# Patient Record
Sex: Female | Born: 1935 | Race: White | Hispanic: No | Marital: Single | Smoking: Never smoker
Health system: Southern US, Community
[De-identification: ages and names within clinical notes are randomized; demographics above are authoritative.]

## PROBLEM LIST (undated history)

## (undated) DIAGNOSIS — F32A Depression, unspecified: Secondary | ICD-10-CM

## (undated) DIAGNOSIS — I82409 Acute embolism and thrombosis of unspecified deep veins of unspecified lower extremity: Secondary | ICD-10-CM

## (undated) DIAGNOSIS — M1990 Unspecified osteoarthritis, unspecified site: Secondary | ICD-10-CM

## (undated) DIAGNOSIS — I4891 Unspecified atrial fibrillation: Secondary | ICD-10-CM

## (undated) DIAGNOSIS — D649 Anemia, unspecified: Secondary | ICD-10-CM

## (undated) DIAGNOSIS — G629 Polyneuropathy, unspecified: Secondary | ICD-10-CM

## (undated) DIAGNOSIS — F329 Major depressive disorder, single episode, unspecified: Secondary | ICD-10-CM

## (undated) HISTORY — DX: Unspecified osteoarthritis, unspecified site: M19.90

## (undated) HISTORY — DX: Acute embolism and thrombosis of unspecified deep veins of unspecified lower extremity: I82.409

## (undated) HISTORY — DX: Major depressive disorder, single episode, unspecified: F32.9

## (undated) HISTORY — PX: UTERINE SUSPENSION: SUR1430

## (undated) HISTORY — DX: Depression, unspecified: F32.A

## (undated) HISTORY — DX: Unspecified atrial fibrillation: I48.91

## (undated) HISTORY — DX: Anemia, unspecified: D64.9

---
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
## 2002-07-29 HISTORY — PX: ROTATOR CUFF REPAIR: SHX139

## 2004-07-29 HISTORY — PX: TIBIA FRACTURE SURGERY: SHX806

## 2011-01-14 ENCOUNTER — Emergency Department (INDEPENDENT_AMBULATORY_CARE_PROVIDER_SITE_OTHER): Payer: Medicare PPO

## 2011-01-14 ENCOUNTER — Emergency Department (HOSPITAL_BASED_OUTPATIENT_CLINIC_OR_DEPARTMENT_OTHER)
Admission: EM | Admit: 2011-01-14 | Discharge: 2011-01-14 | Disposition: A | Discharge status: Home / Self Care | Payer: Medicare PPO | Attending: Emergency Medicine | Admitting: Emergency Medicine

## 2011-01-14 DIAGNOSIS — S2239XA Fracture of one rib, unspecified side, initial encounter for closed fracture: Secondary | ICD-10-CM | POA: Insufficient documentation

## 2011-01-14 DIAGNOSIS — R079 Chest pain, unspecified: Secondary | ICD-10-CM

## 2011-01-14 DIAGNOSIS — I4891 Unspecified atrial fibrillation: Secondary | ICD-10-CM | POA: Insufficient documentation

## 2011-01-14 DIAGNOSIS — W19XXXA Unspecified fall, initial encounter: Secondary | ICD-10-CM | POA: Insufficient documentation

## 2011-01-14 DIAGNOSIS — R109 Unspecified abdominal pain: Secondary | ICD-10-CM

## 2011-01-17 ENCOUNTER — Emergency Department (HOSPITAL_BASED_OUTPATIENT_CLINIC_OR_DEPARTMENT_OTHER)
Admission: EM | Admit: 2011-01-17 | Discharge: 2011-01-17 | Disposition: A | Discharge status: Home / Self Care | Payer: Medicare PPO | Attending: Emergency Medicine | Admitting: Emergency Medicine

## 2011-01-17 DIAGNOSIS — I4891 Unspecified atrial fibrillation: Secondary | ICD-10-CM | POA: Insufficient documentation

## 2011-01-17 DIAGNOSIS — B029 Zoster without complications: Secondary | ICD-10-CM | POA: Insufficient documentation

## 2011-01-28 ENCOUNTER — Emergency Department (HOSPITAL_BASED_OUTPATIENT_CLINIC_OR_DEPARTMENT_OTHER)
Admission: EM | Admit: 2011-01-28 | Discharge: 2011-01-28 | Disposition: A | Discharge status: Home / Self Care | Payer: Medicare PPO | Attending: Emergency Medicine | Admitting: Emergency Medicine

## 2011-01-28 DIAGNOSIS — B029 Zoster without complications: Secondary | ICD-10-CM | POA: Insufficient documentation

## 2011-01-28 DIAGNOSIS — I4891 Unspecified atrial fibrillation: Secondary | ICD-10-CM | POA: Insufficient documentation

## 2011-01-31 ENCOUNTER — Ambulatory Visit (INDEPENDENT_AMBULATORY_CARE_PROVIDER_SITE_OTHER): Payer: Medicare PPO | Admitting: Internal Medicine

## 2011-01-31 DIAGNOSIS — I4891 Unspecified atrial fibrillation: Secondary | ICD-10-CM

## 2011-01-31 DIAGNOSIS — D649 Anemia, unspecified: Secondary | ICD-10-CM

## 2011-01-31 DIAGNOSIS — Z86718 Personal history of other venous thrombosis and embolism: Secondary | ICD-10-CM

## 2011-01-31 DIAGNOSIS — B028 Zoster with other complications: Secondary | ICD-10-CM

## 2011-02-05 ENCOUNTER — Encounter: Payer: Self-pay | Admitting: Internal Medicine

## 2011-02-05 ENCOUNTER — Ambulatory Visit (INDEPENDENT_AMBULATORY_CARE_PROVIDER_SITE_OTHER): Payer: Medicare PPO | Admitting: Internal Medicine

## 2011-02-05 ENCOUNTER — Other Ambulatory Visit: Payer: Self-pay | Admitting: Internal Medicine

## 2011-02-05 ENCOUNTER — Other Ambulatory Visit (HOSPITAL_COMMUNITY)
Admission: RE | Admit: 2011-02-05 | Discharge: 2011-02-05 | Disposition: A | Discharge status: Home / Self Care | Payer: Medicare PPO | Source: Ambulatory Visit | Attending: Internal Medicine | Admitting: Internal Medicine

## 2011-02-05 VITALS — BP 138/86 | HR 92 | Temp 97.3°F | Resp 20 | Ht 64.5 in | Wt 127.0 lb

## 2011-02-05 DIAGNOSIS — B029 Zoster without complications: Secondary | ICD-10-CM

## 2011-02-05 DIAGNOSIS — Z1159 Encounter for screening for other viral diseases: Secondary | ICD-10-CM | POA: Insufficient documentation

## 2011-02-05 DIAGNOSIS — Z780 Asymptomatic menopausal state: Secondary | ICD-10-CM

## 2011-02-05 DIAGNOSIS — N6320 Unspecified lump in the left breast, unspecified quadrant: Secondary | ICD-10-CM

## 2011-02-05 DIAGNOSIS — I82409 Acute embolism and thrombosis of unspecified deep veins of unspecified lower extremity: Secondary | ICD-10-CM | POA: Insufficient documentation

## 2011-02-05 DIAGNOSIS — I4891 Unspecified atrial fibrillation: Secondary | ICD-10-CM

## 2011-02-05 DIAGNOSIS — F3289 Other specified depressive episodes: Secondary | ICD-10-CM

## 2011-02-05 DIAGNOSIS — Z124 Encounter for screening for malignant neoplasm of cervix: Secondary | ICD-10-CM | POA: Insufficient documentation

## 2011-02-05 DIAGNOSIS — Z800 Family history of malignant neoplasm of digestive organs: Secondary | ICD-10-CM

## 2011-02-05 DIAGNOSIS — D649 Anemia, unspecified: Secondary | ICD-10-CM | POA: Insufficient documentation

## 2011-02-05 DIAGNOSIS — F329 Major depressive disorder, single episode, unspecified: Secondary | ICD-10-CM

## 2011-02-05 DIAGNOSIS — I8390 Asymptomatic varicose veins of unspecified lower extremity: Secondary | ICD-10-CM

## 2011-02-05 DIAGNOSIS — M1990 Unspecified osteoarthritis, unspecified site: Secondary | ICD-10-CM | POA: Insufficient documentation

## 2011-02-05 DIAGNOSIS — N63 Unspecified lump in unspecified breast: Secondary | ICD-10-CM

## 2011-02-05 DIAGNOSIS — R634 Abnormal weight loss: Secondary | ICD-10-CM

## 2011-02-05 DIAGNOSIS — Z01419 Encounter for gynecological examination (general) (routine) without abnormal findings: Secondary | ICD-10-CM

## 2011-02-05 MED ORDER — SERTRALINE HCL 25 MG PO TABS: 25.0000 mg | ORAL_TABLET | Freq: Every day | ORAL | Status: DC

## 2011-02-05 NOTE — Progress Notes (Signed)
Subjective:     Patient ID: Felicia Lara, female   DOB: 07-22-1934, 77 y.o.   MRN: [id number]  HPI  Felicia Lara presents today for comprehensive evaluation.  She is tearful and states " I  am living in a horror House"  Here to help with grand-children but mother of child tried to adopt baby away without biologic father being aware of pregnancy.  Lots of fighting in household.  Grand-kids very stressed along with pt. Pt states she is depressed almost daily, not eating and losting weight.  She denies H/D denies suicidal thoughts.  She has not seen a therapist.  She requests an antidepressant.  Crying during interview.  Zoster almost healed .  No  Pain last few days.    Has lotsof dialtaed leg veins  Needs a podiatrist  Allergies  Allergen Reactions  . Ibuprofen Rash   Past Medical History  Diagnosis Date  . Atrial fibrillation   . Herpes zoster   . DVT (deep venous thrombosis)     per pt history  . Arthritis   . Anemia     per pt history   Past Surgical History  Procedure Date  . Tonsillectomy 1944  . Rotator cuff repair 2004  . Tibia fracture surgery 2006  . Uterine suspension   . Uterine suspension    History   Social History  . Marital Status: Single    Spouse Name: N/A    Number of Children: 1  . Years of Education: hs grad   Occupational History  . Not on file.   Social History Main Topics  . Smoking status: Never Smoker   . Smokeless tobacco: Never Used  . Alcohol Use: Yes     socially  . Drug Use: No  . Sexually Active: Not Currently   Other Topics Concern  . Not on file   Social History Narrative  . No narrative on file   Family History  Problem Relation Age of Onset  . Cancer Mother     colon  . Parkinsonism Father   . Alzheimer's disease Brother    Patient Active Problem List  Diagnoses  . Atrial fibrillation  . Arthritis  . Anemia  . Herpes zoster  . DVT (deep venous thrombosis)  . FHx: colon cancer       Review of Systems    Constitutional: Negative for fever and fatigue. Unexpected weight change: pt has lost approx 20 lbs.  HENT: Negative for nosebleeds, sore throat and trouble swallowing.   Eyes: Negative for discharge, redness and visual disturbance.  Respiratory: Negative for cough, chest tightness, shortness of breath and wheezing.   Cardiovascular: Negative.  Negative for chest pain and leg swelling.  Gastrointestinal: Negative for nausea, vomiting, abdominal pain, diarrhea and abdominal distention.  Genitourinary: Negative for dysuria, urgency, frequency, flank pain, vaginal bleeding, vaginal discharge and difficulty urinating.  Musculoskeletal: Negative for myalgias, joint swelling and arthralgias.  Skin: Negative for rash.  Neurological: Negative for dizziness, tremors, seizures, syncope, light-headedness and headaches.  Hematological: Negative.  Negative for adenopathy. Does not bruise/bleed easily.  Psychiatric/Behavioral: Negative for suicidal ideas. The patient is not nervous/anxious.        Objective:   Physical Exam  Constitutional: She appears well-developed and well-nourished. She is cooperative.  HENT:  Head: Normocephalic and atraumatic.  Mouth/Throat: Oropharynx is clear and moist.  Eyes: Conjunctivae and EOM are normal. Pupils are equal, round, and reactive to light.  Neck: No JVD present. Carotid bruit is not present. No mass  and no thyromegaly present.  Cardiovascular: Normal rate, regular rhythm, S1 normal, S2 normal and intact distal pulses.  Exam reveals no gallop and no friction rub.   No murmur heard.      No LE edema.  Multiple venous varicosities LE  Pulmonary/Chest: She has no decreased breath sounds. She has no wheezes. She has no rhonchi. She has no rales. Right breast exhibits no mass, no nipple discharge, no skin change and no tenderness. Left breast exhibits mass (small mass felt 7 0'clock near nipple). Left breast exhibits no nipple discharge, no skin change and no  tenderness.  Abdominal: Bowel sounds are normal. She exhibits no distension. There is no hepatosplenomegaly. There is no tenderness. There is no CVA tenderness.  Genitourinary: Vagina normal and uterus normal. Rectal exam shows no mass and no tenderness. Guaiac negative stool. No breast tenderness or discharge. No labial fusion. There is no rash or lesion on the right labia. There is no rash or lesion on the left labia. Cervix exhibits no motion tenderness, no discharge and no friability. Right adnexum displays no mass and no tenderness. Left adnexum displays no mass and no tenderness.  Musculoskeletal:       No active synovitis in joints.  No erythema, edema to joints  Lymphadenopathy:    She has no cervical adenopathy.    She has no axillary adenopathy.       Right: No inguinal adenopathy present.       Left: No inguinal adenopathy present.  Neurological: She is alert. She has normal strength and normal reflexes. No cranial nerve deficit or sensory deficit.       Grossly nonfocal exam  Skin: No abrasion, no ecchymosis and no lesion noted. No cyanosis or erythema. Nails show no clubbing.       Assessment:   I spent 45 minutes with this pt 1) Depression  2) Herpes Zoster Thoracic  3) Venous Varicosities  4)  Patient Active Problem List  Diagnoses  . Atrial fibrillation  . Arthritis  . Anemia  . Herpes zoster  . DVT (deep venous thrombosis)  . FHx: colon cancer   Will schedule mammogram and bone density    Plan:     1)  Begin Zoloft 25 mg daily.  Gave number to 3 different Psychaitrists for pt to call to schedule apppt.  She voices understanding 2)   Resolving nicely  On no pain meds now 3)  No symptoms now 4)  On coumadin  Has cardiologist in [place] 5) stable 6)  CBC now stable 7)  Had [place]oc[place] i [place] in 2010.  Will need repeat in 2013

## 2011-02-05 NOTE — Patient Instructions (Signed)
Try nutritional supplements Call for appointment with psychiatrist Take medicine as prscribed

## 2011-02-20 ENCOUNTER — Ambulatory Visit
Admission: RE | Admit: 2011-02-20 | Discharge: 2011-02-20 | Disposition: A | Discharge status: Home / Self Care | Payer: Medicare PPO | Source: Ambulatory Visit | Attending: Internal Medicine | Admitting: Internal Medicine

## 2011-02-20 DIAGNOSIS — Z780 Asymptomatic menopausal state: Secondary | ICD-10-CM

## 2011-02-20 DIAGNOSIS — N6320 Unspecified lump in the left breast, unspecified quadrant: Secondary | ICD-10-CM

## 2011-02-25 ENCOUNTER — Encounter: Payer: Self-pay | Admitting: Internal Medicine

## 2011-02-25 DIAGNOSIS — M810 Age-related osteoporosis without current pathological fracture: Secondary | ICD-10-CM | POA: Insufficient documentation

## 2011-02-28 ENCOUNTER — Encounter: Payer: Self-pay | Admitting: Emergency Medicine

## 2011-03-06 ENCOUNTER — Encounter: Payer: Self-pay | Admitting: Emergency Medicine

## 2011-03-19 ENCOUNTER — Ambulatory Visit (INDEPENDENT_AMBULATORY_CARE_PROVIDER_SITE_OTHER): Payer: Medicare PPO | Admitting: Internal Medicine

## 2011-03-19 ENCOUNTER — Other Ambulatory Visit: Payer: Self-pay | Admitting: Emergency Medicine

## 2011-03-19 ENCOUNTER — Encounter: Payer: Self-pay | Admitting: Internal Medicine

## 2011-03-19 DIAGNOSIS — M810 Age-related osteoporosis without current pathological fracture: Secondary | ICD-10-CM

## 2011-03-19 DIAGNOSIS — F329 Major depressive disorder, single episode, unspecified: Secondary | ICD-10-CM

## 2011-03-19 MED ORDER — SERTRALINE HCL 50 MG PO TABS: 50.0000 mg | ORAL_TABLET | Freq: Every day | ORAL | Status: DC

## 2011-03-19 MED ORDER — ALENDRONATE SODIUM 70 MG PO TABS: 70.0000 mg | ORAL_TABLET | ORAL | Status: AC

## 2011-03-19 NOTE — Progress Notes (Signed)
Subjective:    Patient ID: Felicia Lara, female    DOB: [birth year], 77 y.o.   MRN: [id number]  HPI  Felicia Lara is doing better.  She states her grandson has a new job that pays more money.  Still some marital stress in household but her mood is better.  She has not made contact with a psychiatrist as yet.  Zoloft is making her feel better.  Mood is 50% improved.  No suicidal or homicidal ideation.    See BP and bone density.  I reviewed bone density results with her.  She states BP is high because she was lost on the way to office and "I panicked because I was late"  See ultrasound.  No mass seen in L breast.  Allergies  Allergen Reactions  . Ibuprofen Rash   Past Medical History  Diagnosis Date  . Atrial fibrillation   . Herpes zoster   . DVT (deep venous thrombosis)     per pt history  . Arthritis   . Anemia     per pt history   Past Surgical History  Procedure Date  . Tonsillectomy 1944  . Rotator cuff repair 2004  . Tibia fracture surgery 2006  . Uterine suspension   . Uterine suspension    History   Social History  . Marital Status: Single    Spouse Name: N/A    Number of Children: 1  . Years of Education: hs grad   Occupational History  . Not on file.   Social History Main Topics  . Smoking status: Never Smoker   . Smokeless tobacco: Never Used  . Alcohol Use: Yes     socially  . Drug Use: No  . Sexually Active: Not Currently   Other Topics Concern  . Not on file   Social History Narrative  . No narrative on file   Family History  Problem Relation Age of Onset  . Cancer Mother     colon  . Parkinsonism Father   . Alzheimer's disease Brother    Patient Active Problem List  Diagnoses  . Atrial fibrillation  . Arthritis  . Anemia  . Herpes zoster  . DVT (deep venous thrombosis)  . FHx: colon cancer  . Osteoporosis   Current Outpatient Prescriptions on File Prior to Visit  Medication Sig Dispense Refill  . naproxen sodium (ANAPROX) 220 MG  tablet Take 440 mg by mouth daily.        Marland Kitchen warfarin (COUMADIN) 4 MG tablet Take 1 tablet by mouth Daily. 4.5 mg on M,T, W  and 4 mg on Th,Fri,Sat,Sun      . oxyCODONE-acetaminophen (PERCOCET) 5-325 MG per tablet Take 0.5 tablets by mouth Daily.         Review of Systems See HPI    Objective:   Physical Exam Physical Exam  Nursing note and vitals reviewed.  Constitutional: She is oriented to person, place, and time. She appears well-developed and well-nourished.  HENT:  Head: Normocephalic and atraumatic.  Cardiovascular: Normal rate and regular rhythm. Exam reveals no gallop and no friction rub.  No murmur heard.  Pulmonary/Chest: Breath sounds normal. She has no wheezes. She has no rales.  Neurological: She is alert and oriented to person, place, and time.  Skin: Skin is warm and dry.  Psychiatric: She has a normal mood and affect. Her behavior is normal.             Assessment & Plan:  1)  Depression  Will increase  Zoloft to 50 mg daily.  Her mood is brighter today 2)  Elevated  SBP  Will recheck in 2 months 3)  Osteoporosis:  Will start Fosamax 70 mg weekly and refer to PT for balance and fall prevention program.    I discussed in detail  GI side effects of Fosamax along with  ONJ and atypical femur fractures.  Pt voices understanding

## 2011-03-19 NOTE — Patient Instructions (Signed)
Take osteoporosis med once a week standing upright with 8 oz of liquid  Zoloft 50 mg daily  See me in 2 nmonths

## 2011-04-03 ENCOUNTER — Ambulatory Visit: Payer: Medicare PPO | Admitting: Physical Therapy

## 2011-04-05 ENCOUNTER — Encounter: Payer: Self-pay | Admitting: Emergency Medicine

## 2011-04-08 ENCOUNTER — Encounter: Payer: Self-pay | Admitting: Emergency Medicine

## 2011-04-19 ENCOUNTER — Ambulatory Visit: Payer: Medicare PPO | Admitting: Physical Therapy

## 2011-05-20 ENCOUNTER — Other Ambulatory Visit: Payer: Self-pay

## 2011-05-20 ENCOUNTER — Emergency Department (HOSPITAL_BASED_OUTPATIENT_CLINIC_OR_DEPARTMENT_OTHER)
Admission: EM | Admit: 2011-05-20 | Discharge: 2011-05-20 | Disposition: A | Discharge status: Home / Self Care | Payer: Medicare PPO | Attending: Emergency Medicine | Admitting: Emergency Medicine

## 2011-05-20 ENCOUNTER — Telehealth: Payer: Self-pay | Admitting: Internal Medicine

## 2011-05-20 ENCOUNTER — Encounter (HOSPITAL_BASED_OUTPATIENT_CLINIC_OR_DEPARTMENT_OTHER): Payer: Self-pay

## 2011-05-20 ENCOUNTER — Emergency Department (INDEPENDENT_AMBULATORY_CARE_PROVIDER_SITE_OTHER): Payer: Medicare PPO

## 2011-05-20 ENCOUNTER — Ambulatory Visit: Payer: Medicare PPO | Admitting: Internal Medicine

## 2011-05-20 DIAGNOSIS — R3589 Other polyuria: Secondary | ICD-10-CM | POA: Insufficient documentation

## 2011-05-20 DIAGNOSIS — R1032 Left lower quadrant pain: Secondary | ICD-10-CM

## 2011-05-20 DIAGNOSIS — I4891 Unspecified atrial fibrillation: Secondary | ICD-10-CM | POA: Insufficient documentation

## 2011-05-20 DIAGNOSIS — Z86718 Personal history of other venous thrombosis and embolism: Secondary | ICD-10-CM | POA: Insufficient documentation

## 2011-05-20 DIAGNOSIS — N269 Renal sclerosis, unspecified: Secondary | ICD-10-CM

## 2011-05-20 DIAGNOSIS — R358 Other polyuria: Secondary | ICD-10-CM | POA: Insufficient documentation

## 2011-05-20 DIAGNOSIS — N390 Urinary tract infection, site not specified: Secondary | ICD-10-CM

## 2011-05-20 DIAGNOSIS — N281 Cyst of kidney, acquired: Secondary | ICD-10-CM

## 2011-05-20 DIAGNOSIS — I517 Cardiomegaly: Secondary | ICD-10-CM

## 2011-05-20 DIAGNOSIS — Z8739 Personal history of other diseases of the musculoskeletal system and connective tissue: Secondary | ICD-10-CM | POA: Insufficient documentation

## 2011-05-20 LAB — COMPREHENSIVE METABOLIC PANEL
AST: 27 U/L (ref 0–37)
Albumin: 4 g/dL (ref 3.5–5.2)
Alkaline Phosphatase: 89 U/L (ref 39–117)
BUN: 15 mg/dL (ref 6–23)
Chloride: 104 mEq/L (ref 96–112)
Creatinine, Ser: 0.7 mg/dL (ref 0.50–1.10)
Potassium: 4 mEq/L (ref 3.5–5.1)
Total Bilirubin: 0.8 mg/dL (ref 0.3–1.2)
Total Protein: 7.8 g/dL (ref 6.0–8.3)

## 2011-05-20 LAB — URINALYSIS, ROUTINE W REFLEX MICROSCOPIC
Bilirubin Urine: NEGATIVE
Glucose, UA: NEGATIVE mg/dL
Specific Gravity, Urine: 1.012 (ref 1.005–1.030)
pH: 7 (ref 5.0–8.0)

## 2011-05-20 LAB — GLUCOSE, CAPILLARY: Glucose-Capillary: 111 mg/dL — ABNORMAL HIGH (ref 70–99)

## 2011-05-20 LAB — DIFFERENTIAL
Basophils Absolute: 0 10*3/uL (ref 0.0–0.1)
Basophils Relative: 1 % (ref 0–1)
Eosinophils Absolute: 0.1 10*3/uL (ref 0.0–0.7)
Neutro Abs: 5.6 10*3/uL (ref 1.7–7.7)
Neutrophils Relative %: 71 % (ref 43–77)

## 2011-05-20 LAB — CBC
MCH: 28.1 pg (ref 26.0–34.0)
MCHC: 33.7 g/dL (ref 30.0–36.0)
RDW: 13.3 % (ref 11.5–15.5)

## 2011-05-20 LAB — URINE MICROSCOPIC-ADD ON

## 2011-05-20 LAB — LIPASE, BLOOD: Lipase: 34 U/L (ref 11–59)

## 2011-05-20 MED ORDER — FENTANYL CITRATE 0.05 MG/ML IJ SOLN
25.0000 ug | Freq: Once | INTRAMUSCULAR | Status: AC
  Administered 2011-05-20: 25 ug via INTRAVENOUS
  Filled 2011-05-20: qty 2

## 2011-05-20 MED ORDER — SODIUM CHLORIDE 0.9 % IV SOLN
Freq: Once | INTRAVENOUS | Status: AC
  Administered 2011-05-20: 04:00:00 via INTRAVENOUS

## 2011-05-20 MED ORDER — ONDANSETRON HCL 4 MG/2ML IJ SOLN
4.0000 mg | Freq: Once | INTRAMUSCULAR | Status: AC
  Administered 2011-05-20: 4 mg via INTRAVENOUS
  Filled 2011-05-20: qty 2

## 2011-05-20 MED ORDER — CIPROFLOXACIN HCL 500 MG PO TABS: 500.0000 mg | ORAL_TABLET | Freq: Two times a day (BID) | ORAL | Status: AC

## 2011-05-20 MED ORDER — HYDROCODONE-ACETAMINOPHEN 5-500 MG PO TABS: 1.0000 | ORAL_TABLET | Freq: Four times a day (QID) | ORAL | Status: AC | PRN

## 2011-05-20 MED ORDER — IOHEXOL 300 MG/ML  SOLN
100.0000 mL | Freq: Once | INTRAMUSCULAR | Status: AC | PRN
  Administered 2011-05-20: 100 mL via INTRAVENOUS

## 2011-05-20 NOTE — ED Notes (Signed)
Pt states that she has severe LLQ abdominal pain which is radiating to her back and she is experiencing polyuria.  Pt states that she cannot get comfortable and has been experiencing insomnia due to the pain.

## 2011-05-20 NOTE — Telephone Encounter (Signed)
Spoke with pt in f/u of her ED visit this morning.  She went to ED for Abd pain and urine culture was sent.  Per Dr Constance Goltz  She will call in antibiotic for UTI and reschedule her appt with Dr Constance Goltz this week.  Pt advised to f/u with her cardiologist about her elevated INR.

## 2011-05-20 NOTE — Telephone Encounter (Signed)
Please call pt and inquire if she was placed on an antibiotic before she left ER and have her schedule a follow up appt with me.  I will want to recheck her urinalysis after she off antibiotics.  She can see me anytime in office.  PT/INR should be checked in 2 days at cardiology office who adjusts her dosage or if any problem we can check her INR

## 2011-05-20 NOTE — Telephone Encounter (Signed)
Felicia Lara be sure to tell her not to take any Coumadin until she has her INR repeated and is at safe levels as antibiotics will raise her INR

## 2011-05-20 NOTE — ED Provider Notes (Addendum)
History     CSN: [id number] Arrival date & time: 05/20/2011  3:13 AM   First MD Initiated Contact with Patient 05/20/11 [phone]      Chief Complaint  Patient presents with  . Abdominal Pain    radiates to the back  . Polyuria    (Consider location/radiation/quality/duration/timing/severity/associated sxs/prior treatment) Patient is a 77 y.o. female presenting with abdominal pain. The history is provided by the patient.  Abdominal Pain The primary symptoms of the illness include abdominal pain. The primary symptoms of the illness do not include fever, shortness of breath or dysuria.  Additional symptoms associated with the illness include frequency. Symptoms associated with the illness do not include chills, urgency, hematuria or back pain.   left lower quadrant abdominal pain, sharp in nature, radiates to left inguinal region. Started today. Moderate to severe. No associated hematuria or dysuria. No constipation or diarrhea. No blood in stool. Some nausea no vomiting. No history of same. No trauma. No bruising. Patient has had shingles that has affected other regions of her trunk but none this area. No history of diverticulitis or colitis. Pain has been constant and persistent  Past Medical History  Diagnosis Date  . Atrial fibrillation   . Herpes zoster   . DVT (deep venous thrombosis)     per pt history  . Arthritis   . Anemia     per pt history    Past Surgical History  Procedure Date  . Tonsillectomy 1944  . Rotator cuff repair 2004  . Tibia fracture surgery 2006  . Uterine suspension     Family History  Problem Relation Age of Onset  . Cancer Mother     colon  . Parkinsonism Father   . Alzheimer's disease Brother     History  Substance Use Topics  . Smoking status: Never Smoker   . Smokeless tobacco: Never Used  . Alcohol Use: Yes     socially    OB History    Grav Para Term Preterm Abortions TAB SAB Ect Mult Living                  Review of Systems    Constitutional: Negative for fever and chills.  HENT: Negative for sore throat, neck pain and neck stiffness.   Eyes: Negative for pain.  Respiratory: Negative for shortness of breath.   Cardiovascular: Negative for chest pain.  Gastrointestinal: Positive for abdominal pain.  Genitourinary: Positive for frequency. Negative for dysuria, urgency and hematuria.  Musculoskeletal: Negative for back pain.  Skin: Negative for rash.  Neurological: Negative for headaches.  All other systems reviewed and are negative.    Allergies  Ibuprofen  Home Medications   Current Outpatient Rx  Name Route Sig Dispense Refill  . WARFARIN SODIUM 4 MG PO TABS Oral Take 1 tablet by mouth Daily. 4.5 mg on M,T, W  and 4 mg on Th,Fri,Sat,Sun    . ALENDRONATE SODIUM 70 MG PO TABS Oral Take 1 tablet (70 mg total) by mouth every 7 (seven) days. Take with a full glass of water on an empty stomach. 12 tablet 1  . NAPROXEN SODIUM 220 MG PO TABS Oral Take 440 mg by mouth daily.      . SERTRALINE HCL 50 MG PO TABS Oral Take 1 tablet (50 mg total) by mouth daily. 90 tablet 2    BP 167/94  Pulse 94  Temp(Src) 98.1 F (36.7 C) (Oral)  Resp 20  SpO2 98%  Physical Exam  Constitutional: She is oriented to person, place, and time. She appears well-developed and well-nourished.  HENT:  Head: Normocephalic and atraumatic.  Eyes: Conjunctivae and EOM are normal. Pupils are equal, round, and reactive to light.  Neck: Trachea normal. Neck supple. No thyromegaly present.  Cardiovascular: Normal rate, regular rhythm, S1 normal, S2 normal and normal pulses.     No systolic murmur is present   No diastolic murmur is present  Pulses:      Radial pulses are 2+ on the right side, and 2+ on the left side.  Pulmonary/Chest: Effort normal and breath sounds normal. She has no wheezes. She has no rhonchi. She has no rales. She exhibits no tenderness.  Abdominal: Soft. Normal appearance and bowel sounds are normal. There is no  CVA tenderness and negative Murphy's sign.       To palpation over left lower quadrant abdomen. No CVA tenderness. No guarding or rebound. No ecchymosis or discoloration. No rash or masses. No palpable hernia  Musculoskeletal:       BLE:s Calves nontender, no cords or erythema, negative Homans sign  Neurological: She is alert and oriented to person, place, and time. She has normal strength. No cranial nerve deficit or sensory deficit. GCS eye subscore is 4. GCS verbal subscore is 5. GCS motor subscore is 6.  Skin: Skin is warm and dry. No rash noted. She is not diaphoretic.  Psychiatric: Her speech is normal.       Cooperative and appropriate    ED Course  Procedures (including critical care time)  Results for orders placed during the hospital encounter of 05/20/11  URINALYSIS, ROUTINE W REFLEX MICROSCOPIC      Component Value Range   Color, Urine YELLOW  YELLOW    Appearance CLEAR  CLEAR    Specific Gravity, Urine 1.012  1.005 - 1.030    pH 7.0  5.0 - 8.0    Glucose, UA NEGATIVE  NEGATIVE (mg/dL)   Hgb urine dipstick LARGE (*) NEGATIVE    Bilirubin Urine NEGATIVE  NEGATIVE    Ketones, ur NEGATIVE  NEGATIVE (mg/dL)   Protein, ur NEGATIVE  NEGATIVE (mg/dL)   Urobilinogen, UA 0.2  0.0 - 1.0 (mg/dL)   Nitrite NEGATIVE  NEGATIVE    Leukocytes, UA MODERATE (*) NEGATIVE   CBC      Component Value Range   WBC 7.9  4.0 - 10.5 (K/uL)   RBC 4.70  3.87 - 5.11 (MIL/uL)   Hemoglobin 13.2  12.0 - 15.0 (g/dL)   HCT 04.5  40.9 - 81.1 (%)   MCV 83.4  78.0 - 100.0 (fL)   MCH 28.1  26.0 - 34.0 (pg)   MCHC 33.7  30.0 - 36.0 (g/dL)   RDW 91.4  78.2 - 95.6 (%)   Platelets 263  150 - 400 (K/uL)  DIFFERENTIAL      Component Value Range   Neutrophils Relative 71  43 - 77 (%)   Neutro Abs 5.6  1.7 - 7.7 (K/uL)   Lymphocytes Relative 17  12 - 46 (%)   Lymphs Abs 1.4  0.7 - 4.0 (K/uL)   Monocytes Relative 10  3 - 12 (%)   Monocytes Absolute 0.8  0.1 - 1.0 (K/uL)   Eosinophils Relative 2  0 - 5 (%)    Eosinophils Absolute 0.1  0.0 - 0.7 (K/uL)   Basophils Relative 1  0 - 1 (%)   Basophils Absolute 0.0  0.0 - 0.1 (K/uL)  COMPREHENSIVE METABOLIC PANEL  Component Value Range   Sodium 140  135 - 145 (mEq/L)   Potassium 4.0  3.5 - 5.1 (mEq/L)   Chloride 104  96 - 112 (mEq/L)   CO2 26  19 - 32 (mEq/L)   Glucose, Bld 107 (*) 70 - 99 (mg/dL)   BUN 15  6 - 23 (mg/dL)   Creatinine, Ser 1.61  0.50 - 1.10 (mg/dL)   Calcium 9.4  8.4 - 09.6 (mg/dL)   Total Protein 7.8  6.0 - 8.3 (g/dL)   Albumin 4.0  3.5 - 5.2 (g/dL)   AST 27  0 - 37 (U/L)   ALT 18  0 - 35 (U/L)   Alkaline Phosphatase 89  39 - 117 (U/L)   Total Bilirubin 0.8  0.3 - 1.2 (mg/dL)   GFR calc non Af Amer 83 (*) >90 (mL/min)   GFR calc Af Amer >90  >90 (mL/min)  LIPASE, BLOOD      Component Value Range   Lipase 34  11 - 59 (U/L)  URINE MICROSCOPIC-ADD ON      Component Value Range   Squamous Epithelial / LPF RARE  RARE    WBC, UA 3-6  <3 (WBC/hpf)   RBC / HPF 11-20  <3 (RBC/hpf)   Bacteria, UA RARE  RARE    Casts HYALINE CASTS (*) NEGATIVE    Ct Abdomen Pelvis W Contrast  05/20/2011  *RADIOLOGY REPORT*  Clinical Data: Left lower quadrant abdominal pain, radiating to the back.  Polyuria.  CT ABDOMEN AND PELVIS WITH CONTRAST  Technique:  Multidetector CT imaging of the abdomen and pelvis was performed following the standard protocol during bolus administration of intravenous contrast.  Contrast: OMNIPAQUE IOHEXOL 300 MG/ML IV SOLN  Comparison: None.  Findings: Minimal bibasilar atelectasis is noted.  There is biatrial cardiac enlargement.  The liver and spleen are unremarkable in appearance.  The gallbladder is within normal limits.  The pancreas and adrenal glands are unremarkable.  There is mild left-sided renal atrophy.  A small 0.5 cm cyst is noted at the lower pole of the left kidney on delayed images.  The kidneys are otherwise unremarkable in appearance.  There is no evidence of hydronephrosis.  No renal or  ureteral stones are seen. No significant perinephric stranding is appreciated.  No free fluid is identified.  The small bowel is unremarkable in appearance.  The stomach is filled with contrast and is within normal limits.  No acute vascular abnormalities are seen.  An IVC filter is noted in expected position.  The appendix is normal in caliber and contains air, without evidence for appendicitis.  There is redundancy of the transverse colon; the colon is partially filled with stool and contrast, and is unremarkable in appearance.  The bladder is mildly distended and grossly unremarkable in appearance.  The patient is status post hysterectomy.  No suspicious adnexal masses are seen.  No inguinal lymphadenopathy is seen.  No acute osseous abnormalities are identified.  Vacuum phenomenon is noted at L4-L5, with associated disc space narrowing.  IMPRESSION:  1.  No acute abnormalities identified within the abdomen or pelvis. 2.  Mild left-sided renal atrophy and small left renal cyst.  No evidence of hydronephrosis.  No obstructing ureteral stones seen. 3.  Mild degenerative change at the lower lumbar spine. 4.  Biatrial cardiac enlargement noted.  Original Report Authenticated By: Tonia Ghent, M.D.    Date: 05/20/2011  Rate: 98  Rhythm: atrial fibrillation  QRS Axis: normal  Intervals: normal  ST/T  Wave abnormalities: nonspecific ST changes  Conduction Disutrbances:none  Narrative Interpretation:   Old EKG Reviewed: none available     MDM   Left lower quadrant abdominal pain evaluated with labs and CT scan as above. IV fluids and pain control IV fentanyl. No CT findings to explain symptoms. UA reviewed and urine culture sent. No vomiting or fevers. No peritonitis on serial exams. Possible small kidney stone not seen on CT. Doubt mesenteric ischemia given reproducible tenderness. Reliable historian states understanding discharge instructions. Has scheduled followup with her doctor in 2 days and agrees  to strict return precautions for any worsening condition.  INR is elevated. Patient not having active bleeding. Plan hold Coumadin until evaluated by PCP in 48 hours for recheck at that time.   Sunnie Nielsen, MD 05/20/11 0630  Sunnie Nielsen, MD 05/20/11 [phone]

## 2011-05-21 LAB — URINE CULTURE
Colony Count: 4000
Culture  Setup Time: 201210221840

## 2011-05-27 ENCOUNTER — Ambulatory Visit: Payer: Medicare PPO | Admitting: Internal Medicine

## 2011-07-09 ENCOUNTER — Encounter: Payer: Self-pay | Admitting: Internal Medicine

## 2011-07-09 ENCOUNTER — Ambulatory Visit (INDEPENDENT_AMBULATORY_CARE_PROVIDER_SITE_OTHER): Payer: Medicare PPO | Admitting: Internal Medicine

## 2011-07-09 VITALS — BP 132/84 | HR 100 | Temp 96.6°F | Ht 64.5 in | Wt 131.0 lb

## 2011-07-09 DIAGNOSIS — F329 Major depressive disorder, single episode, unspecified: Secondary | ICD-10-CM

## 2011-07-09 MED ORDER — SERTRALINE HCL 50 MG PO TABS: 50.0000 mg | ORAL_TABLET | Freq: Every day | ORAL | Status: DC

## 2011-07-09 NOTE — Patient Instructions (Signed)
Take medicine as prescribed  Call office if worsening depression or go to ER

## 2011-07-09 NOTE — Progress Notes (Signed)
Subjective:    Patient ID: Felicia Lara, female    DOB: [birth year], 77 y.o.   MRN: [id number]  HPI  Felicia Lara returns for follow up.  She reports she never started Zoloft as there have been so many bills where she lives with her grandson.  Her grandson has financial power of attorney but pt never told him she needs the anti-depressant.  She still feels depressed but tries to read and keep occupied.  She helps take care of her 6 great grandkids.    She denies suicidal or homicidal ideation,  No psychotic features.  She would like a shingles vaccine.  She had shingles outbreak in [place]  She was seen in ER in October for abd pain.  CT abd/pelvis with contrast  Unrevealing.  Small R renal cyst.  Biatrial enlargement.  She has not had any further pain and did not follow up with me.  Allergies  Allergen Reactions  . Ibuprofen Rash   Past Medical History  Diagnosis Date  . Atrial fibrillation   . Herpes zoster   . DVT (deep venous thrombosis)     per pt history  . Arthritis   . Anemia     per pt history  . Osteoporosis   . Depression    Past Surgical History  Procedure Date  . Tonsillectomy 1944  . Rotator cuff repair 2004  . Tibia fracture surgery 2006  . Uterine suspension    History   Social History  . Marital Status: Single    Spouse Name: N/A    Number of Children: 1  . Years of Education: hs grad   Occupational History  . Not on file.   Social History Main Topics  . Smoking status: Never Smoker   . Smokeless tobacco: Never Used  . Alcohol Use: Yes     socially  . Drug Use: No  . Sexually Active: Not Currently   Other Topics Concern  . Not on file   Social History Narrative  . No narrative on file   Family History  Problem Relation Age of Onset  . Cancer Mother     colon  . Parkinsonism Father   . Alzheimer's disease Brother    Patient Active Problem List  Diagnoses  . Atrial fibrillation  . Arthritis  . Anemia  . Herpes zoster  . DVT (deep venous  thrombosis)  . FHx: colon cancer  . Osteoporosis   Current Outpatient Prescriptions on File Prior to Visit  Medication Sig Dispense Refill  . warfarin (COUMADIN) 4 MG tablet Take 1 tablet by mouth Daily. 4.5 mg on M,T, W  and 4 mg on Th,Fri,Sat,Sun      . alendronate (FOSAMAX) 70 MG tablet Take 1 tablet (70 mg total) by mouth every 7 (seven) days. Take with a full glass of water on an empty stomach.  12 tablet  1  . naproxen sodium (ANAPROX) 220 MG tablet Take 440 mg by mouth daily.              Review of Systems See HPI    Objective:   Physical Exam  Physical Exam  Nursing note and vitals reviewed. Flat affect Constitutional: She is oriented to person, place, and time. She appears well-developed and well-nourished.  HENT:  Head: Normocephalic and atraumatic.  Cardiovascular: Normal rate and regular rhythm. Exam reveals no gallop and no friction rub.  No murmur heard.  Pulmonary/Chest: Breath sounds normal. She has no wheezes. She has no rales.  Neurological: She  is alert and oriented to person, place, and time.  Skin: Skin is warm and dry.  Psychiatric:  Flat affect. Her behavior is normal.         Assessment & Plan:  1)  Depression:  I hand signed a Zoloft RX and gave topt.  She refuses counseling.  I did let her know about emergency psychiatric services at [facility] ER if she worsens 2)  H/o herpes zoster:  I gave Rx for Zostavax to obtain at pharmacy 3)  Osteoporosis:  I had given her Fosamax.  Hopefully she is taking this

## 2011-08-20 ENCOUNTER — Ambulatory Visit: Payer: Medicare PPO | Admitting: Internal Medicine

## 2011-09-16 ENCOUNTER — Ambulatory Visit (INDEPENDENT_AMBULATORY_CARE_PROVIDER_SITE_OTHER): Payer: Medicare PPO | Admitting: Internal Medicine

## 2011-09-16 ENCOUNTER — Encounter: Payer: Self-pay | Admitting: Internal Medicine

## 2011-09-16 DIAGNOSIS — L259 Unspecified contact dermatitis, unspecified cause: Secondary | ICD-10-CM

## 2011-09-16 DIAGNOSIS — H6691 Otitis media, unspecified, right ear: Secondary | ICD-10-CM

## 2011-09-16 DIAGNOSIS — F329 Major depressive disorder, single episode, unspecified: Secondary | ICD-10-CM

## 2011-09-16 DIAGNOSIS — L309 Dermatitis, unspecified: Secondary | ICD-10-CM

## 2011-09-16 DIAGNOSIS — H6690 Otitis media, unspecified, unspecified ear: Secondary | ICD-10-CM

## 2011-09-16 DIAGNOSIS — F32A Depression, unspecified: Secondary | ICD-10-CM | POA: Insufficient documentation

## 2011-09-16 MED ORDER — NYSTATIN-TRIAMCINOLONE 100000-0.1 UNIT/GM-% EX OINT: TOPICAL_OINTMENT | Freq: Two times a day (BID) | CUTANEOUS | Status: DC

## 2011-09-16 MED ORDER — AZITHROMYCIN 250 MG PO TABS: ORAL_TABLET | ORAL | Status: AC

## 2011-09-16 MED ORDER — CEFTRIAXONE SODIUM 1 G IJ SOLR
500.0000 mg | Freq: Once | INTRAMUSCULAR | Status: AC
  Administered 2011-09-16: 500 mg via INTRAMUSCULAR

## 2011-09-16 NOTE — Progress Notes (Signed)
Subjective:    Patient ID: Felicia Lara, female    DOB: 1933/09/09, 78 y.o.   MRN: [id number]  HPI Felicia Lara is here to follow up on her depression after inititating Zoloft.  She says she feels about 50% improved.  She does not want to go to the expense of counseling.  She is trying to apply for Medicaid as finances are a real challenge.  Mood less sad, able to concentrate better,    She has not started the fosamax for her osteoporosis due to financial challenges.  She has sore throat and R ear pain, no ear drainange  .  No fever, sob or chest pain.   Great grandchild is sick now.    Also has itchy rash in area of prior Zoster.  She scratches quite a bit.  6 great graqndchildren in house do not have rash.  Allergies  Allergen Reactions  . Ibuprofen Rash   Past Medical History  Diagnosis Date  . Atrial fibrillation   . Herpes zoster   . DVT (deep venous thrombosis)     per pt history  . Arthritis   . Anemia     per pt history  . Osteoporosis   . Depression    Past Surgical History  Procedure Date  . Tonsillectomy 1944  . Rotator cuff repair 2004  . Tibia fracture surgery 2006  . Uterine suspension    History   Social History  . Marital Status: Single    Spouse Name: N/A    Number of Children: 1  . Years of Education: hs grad   Occupational History  . Not on file.   Social History Main Topics  . Smoking status: Never Smoker   . Smokeless tobacco: Never Used  . Alcohol Use: Yes     socially  . Drug Use: No  . Sexually Active: Not Currently   Other Topics Concern  . Not on file   Social History Narrative  . No narrative on file   Family History  Problem Relation Age of Onset  . Cancer Mother     colon  . Parkinsonism Father   . Alzheimer's disease Brother    Patient Active Problem List  Diagnoses  . Atrial fibrillation  . Arthritis  . Anemia  . Herpes zoster  . DVT (deep venous thrombosis)  . FHx: colon cancer  . Osteoporosis  . Depression    Current Outpatient Prescriptions on File Prior to Visit  Medication Sig Dispense Refill  . sertraline (ZOLOFT) 50 MG tablet Take 1 tablet (50 mg total) by mouth daily.  90 tablet  2  . warfarin (COUMADIN) 4 MG tablet Take 1 tablet by mouth Daily. 4.5 mg on M,T, W  and 4 mg on Th,Fri,Sat,Sun      . alendronate (FOSAMAX) 70 MG tablet Take 1 tablet (70 mg total) by mouth every 7 (seven) days. Take with a full glass of water on an empty stomach.  12 tablet  1  . naproxen sodium (ANAPROX) 220 MG tablet Take 440 mg by mouth daily.         No current facility-administered medications on file prior to visit.        Review of Systems See HPI    Objective:   Physical Exam Physical Exam  Constitutional: She is oriented to person, place, and time. She appears well-developed and well-nourished. She is cooperative.  HENT:  Head: Normocephalic and atraumatic.  Right Ear:  Erythematous, edematous drum.  Canal clear  Left  Ear: A middle ear effusion is present.  Nose: Mucosal edema present. Right sinus exhibits maxillary sinus tenderness. Left sinus exhibits maxillary sinus tenderness.  Mouth/Throat: Posterior oropharyngeal erythema present.  Serous effusion bilaterally  Eyes: Conjunctivae and EOM are normal. Pupils are equal, round, and reactive to light.  Neck: Neck supple. Carotid bruit is not present. No mass present.  Cardiovascular: Regular rhythm, normal heart sounds, intact distal pulses and normal pulses. Exam reveals no gallop and no friction rub.  No murmur heard.  Pulmonary/Chest: Breath sounds normal. She has no wheezes. She has no rhonchi. She has no rales.  Neurological: She is alert and oriented to person, place, and time.  Skin: Skin is warm and dry. No abrasion, no bruising, no ecchymosis and no rash noted. No cyanosis. Nails show no clubbing.  Psychiatric: She has a normal mood and affect. Her speech is normal and behavior is normal.       Assessment & Plan:  1)   Depression  Improving with Zoloft. 2)  R OM will give 500 mg Rocephin today  And Z-pak 3)  Osteopporosis  Will refer to case management to assess finances as assistance with meds 4)  Dermatitis: Is not typical rash of Zoster but scratching.  Antibiotics will help prevent secondary infection and will try mycolog for now  Return in 3-4 months   Will refer to [facility] case management

## 2011-09-16 NOTE — Patient Instructions (Signed)
Take antibiotic as prescribed  Make follow up appt with your cardiologist  Use cream twice a day to rash  See me in 3-4 months

## 2011-12-24 ENCOUNTER — Ambulatory Visit: Payer: Medicare PPO | Admitting: Internal Medicine

## 2011-12-26 ENCOUNTER — Ambulatory Visit: Payer: Medicare PPO | Admitting: Internal Medicine

## 2012-01-02 ENCOUNTER — Ambulatory Visit: Payer: Medicare PPO | Admitting: Internal Medicine

## 2012-02-06 ENCOUNTER — Encounter: Payer: Self-pay | Admitting: Internal Medicine

## 2012-02-06 ENCOUNTER — Ambulatory Visit (INDEPENDENT_AMBULATORY_CARE_PROVIDER_SITE_OTHER): Payer: Medicare PPO | Admitting: Internal Medicine

## 2012-02-06 VITALS — BP 134/74 | HR 80 | Temp 97.1°F | Resp 16 | Ht 64.5 in | Wt 134.0 lb

## 2012-02-06 DIAGNOSIS — M25519 Pain in unspecified shoulder: Secondary | ICD-10-CM

## 2012-02-06 NOTE — Progress Notes (Signed)
Subjective:    Patient ID: Felicia Lara, female    DOB: [birth year], 78 y.o.   MRN: [id number]  HPI Felicia Lara is here for folllow up.  Mood is improved but stilll lots of tension in household.  Grandson's wife is pregnant again.  They currently have 6 children     Zoloft has helped with depressed mood   Felicia Lara has pain in R shoulder and her L knee.  Longstanding DJD in both places secondary to remote tibial fracture on L and rotator cuff repair of R shoulder.  Surgery done in [place].   No locking of knee.  Swells at times but not swollen now  She is able to take Fosamax intermittantly for her osteoporosis.    Allergies  Allergen Reactions  . Ibuprofen Rash   Past Medical History  Diagnosis Date  . Atrial fibrillation   . Herpes zoster   . DVT (deep venous thrombosis)     per pt history  . Arthritis   . Anemia     per pt history  . Osteoporosis   . Depression    Past Surgical History  Procedure Date  . Tonsillectomy 1944  . Rotator cuff repair 2004  . Tibia fracture surgery 2006  . Uterine suspension    History   Social History  . Marital Status: Single    Spouse Name: N/A    Number of Children: 1  . Years of Education: hs grad   Occupational History  . Not on file.   Social History Main Topics  . Smoking status: Never Smoker   . Smokeless tobacco: Never Used  . Alcohol Use: Yes     socially  . Drug Use: No  . Sexually Active: Not Currently   Other Topics Concern  . Not on file   Social History Narrative  . No narrative on file   Family History  Problem Relation Age of Onset  . Cancer Mother     colon  . Parkinsonism Father   . Alzheimer's disease Brother    Patient Active Problem List  Diagnosis  . Atrial fibrillation  . Arthritis  . Anemia  . Herpes zoster  . DVT (deep venous thrombosis)  . FHx: colon cancer  . Osteoporosis  . Depression   Current Outpatient Prescriptions on File Prior to Visit  Medication Sig Dispense Refill  .  alendronate (FOSAMAX) 70 MG tablet Take 1 tablet (70 mg total) by mouth every 7 (seven) days. Take with a full glass of water on an empty stomach.  12 tablet  1  . naproxen sodium (ANAPROX) 220 MG tablet Take 440 mg by mouth daily.        . sertraline (ZOLOFT) 50 MG tablet Take 1 tablet (50 mg total) by mouth daily.  90 tablet  2  . warfarin (COUMADIN) 4 MG tablet Take 1 tablet by mouth Daily. 4.5 mg on M,T, W  and 4 mg on Th,Fri,Sat,Sun      . nystatin-triamcinolone ointment (MYCOLOG) Apply topically 2 (two) times daily.  30 g  0       Review of Systems See HPI    Objective:   Physical Exam  Physical Exam  Nursing note and vitals reviewed.  Constitutional: She is oriented to person, place, and time. She appears well-developed and well-nourished.  HENT:  Head: Normocephalic and atraumatic.  Cardiovascular: Normal rate and regular rhythm. Exam reveals no gallop and no friction rub.  No murmur heard.  Pulmonary/Chest: Breath sounds normal. She has no  wheezes. She has no rales.  Neurological: She is alert and oriented to person, place, and time.  Skin: Skin is warm and dry.  Psychiatric: She has a normal mood and affect. Her behavior is normal.  R shoulder  I/E rotation limited due to pain. Crepitus L knee.  Well healed scar          Assessment & Plan:  L knee /Rshoulder pain:  Will refer to Dr. Pearletha Forge for possible steroid injection  Depression  Controlled  on zoloft  Osteoporosis  On Fosamax as she can afford.    DVT/Afib  Sees [facility]  See me 3-4 months

## 2012-02-06 NOTE — Patient Instructions (Addendum)
Will set up referral to Dr. Pearletha Forge  See me in December

## 2012-02-11 ENCOUNTER — Encounter: Payer: Self-pay | Admitting: Family Medicine

## 2012-02-11 ENCOUNTER — Ambulatory Visit (INDEPENDENT_AMBULATORY_CARE_PROVIDER_SITE_OTHER): Payer: Medicare PPO | Admitting: Family Medicine

## 2012-02-11 ENCOUNTER — Ambulatory Visit (HOSPITAL_BASED_OUTPATIENT_CLINIC_OR_DEPARTMENT_OTHER)
Admission: RE | Admit: 2012-02-11 | Discharge: 2012-02-11 | Disposition: A | Discharge status: Home / Self Care | Payer: Medicare PPO | Source: Ambulatory Visit | Attending: Family Medicine | Admitting: Family Medicine

## 2012-02-11 VITALS — BP 135/78 | HR 97 | Temp 97.9°F | Ht 65.0 in | Wt 130.0 lb

## 2012-02-11 DIAGNOSIS — M542 Cervicalgia: Secondary | ICD-10-CM | POA: Insufficient documentation

## 2012-02-11 DIAGNOSIS — M25569 Pain in unspecified knee: Secondary | ICD-10-CM | POA: Insufficient documentation

## 2012-02-11 DIAGNOSIS — M5412 Radiculopathy, cervical region: Secondary | ICD-10-CM

## 2012-02-11 DIAGNOSIS — M25511 Pain in right shoulder: Secondary | ICD-10-CM

## 2012-02-11 DIAGNOSIS — M25562 Pain in left knee: Secondary | ICD-10-CM

## 2012-02-11 DIAGNOSIS — M25519 Pain in unspecified shoulder: Secondary | ICD-10-CM | POA: Insufficient documentation

## 2012-02-11 DIAGNOSIS — M5030 Other cervical disc degeneration, unspecified cervical region: Secondary | ICD-10-CM | POA: Insufficient documentation

## 2012-02-11 DIAGNOSIS — M79609 Pain in unspecified limb: Secondary | ICD-10-CM | POA: Insufficient documentation

## 2012-02-11 DIAGNOSIS — Z8781 Personal history of (healed) traumatic fracture: Secondary | ICD-10-CM | POA: Insufficient documentation

## 2012-02-11 MED ORDER — TRAMADOL HCL 50 MG PO TABS: 50.0000 mg | ORAL_TABLET | Freq: Three times a day (TID) | ORAL | Status: AC | PRN

## 2012-02-11 NOTE — Patient Instructions (Addendum)
Your shoulder pain is due to cervical radiculopathy (a pinched nerve in your neck) from underlying severe arthritis. Take tylenol 500mg  1-2 tabs three times a day regularly for pain relief. Prednisone and anti-inflammatories (aleve, ibuprofen) are considerations but are risky for a number of reasons - primarily that you're on coumadin and you have osteoporosis.   Tramadol as needed for severe pain - take up to 3 times a day as needed - can make you sleepy - no driving on this medicine. Consider cervical collar if severely painful. Simple range of motion exercises within limits of pain to prevent further stiffness. Start physical therapy for stretching, exercises, traction, and modalities. Heat 15 minutes at a time 3-4 times a day to help with spasms. Watch head position when on computers, texting, when sleeping in bed - should in line with back to prevent further nerve traction and irritation. Consider home traction unit if you get benefit with this in physical therapy. If not improving we will consider an MRI.  For your left knee you have moderate arthritis. Take tylenol 500mg  1-2 tabs three times a day for pain. Glucosamine sulfate 750mg  twice a day is a supplement that has been shown to help moderate to severe arthritis. Capsaicin topically up to four times a day may also help with pain for this and your neck. Cortisone injections are an option - you were given this today. If cortisone injections do not help, there are different types of shots that may help but they take longer to take effect. It's important that you continue to stay active. Heat or ice 15 minutes at a time 3-4 times a day as needed to help with pain. Water aerobics and cycling with low resistance are the best two types of exercise for arthritis. Follow up with me in 5-6 weeks for reevaluation.

## 2012-02-14 ENCOUNTER — Encounter: Payer: Self-pay | Admitting: Family Medicine

## 2012-02-14 DIAGNOSIS — M25562 Pain in left knee: Secondary | ICD-10-CM | POA: Insufficient documentation

## 2012-02-14 DIAGNOSIS — M5412 Radiculopathy, cervical region: Secondary | ICD-10-CM | POA: Insufficient documentation

## 2012-02-14 NOTE — Assessment & Plan Note (Signed)
2/2 DJD.  Intraarticular injection given today.  Tylenol, tramadol.  Consider glucosamine and capsaicin.  Viscosupplementation if not improving with cortisone injections.  See instructions for further.  After informed written consent, patient was seated on exam table. Left knee was prepped with alcohol swab and utilizing anteromedial approach, patient's left knee was injected intraarticularly with 3:1 marcaine: depomedrol. Patient tolerated the procedure well without immediate complications.

## 2012-02-14 NOTE — Progress Notes (Signed)
Subjective:    Patient ID: Felicia Lara, female    DOB: 10-Aug-1933, 78 y.o.   MRN: [id number]  PCP: Dr. Constance Goltz  HPI 78 yo F here for right shoulder and left knee pain.  1. Right shoulder pain Denies known injury. Prior history of right rotator cuff surgery (unsure if repaired or debrided - believes just had scope and debridement). Was better after this. States pain goes from neck and post shoulder into right arm. Worse at end of day. Wakes up at night with this. Not worse with any shoulder motions. Is right handed. No numbness/tingling. Gets spasms into neck as well. No bowel/bladder dysfunction.  2. Left knee pain Told she had arthritis in past. No known injury - several months of aching pain. Worse with prolonged sitting Catching but no locking or giving out. H/o injection remotely for knee that helped. Has had ORIF for tibial plateau fracture in this knee.  Past Medical History  Diagnosis Date  . Atrial fibrillation   . Herpes zoster   . DVT (deep venous thrombosis)     per pt history  . Arthritis   . Anemia     per pt history  . Osteoporosis   . Depression     Current Outpatient Prescriptions on File Prior to Visit  Medication Sig Dispense Refill  . alendronate (FOSAMAX) 70 MG tablet Take 1 tablet (70 mg total) by mouth every 7 (seven) days. Take with a full glass of water on an empty stomach.  12 tablet  1  . nystatin-triamcinolone ointment (MYCOLOG) Apply topically 2 (two) times daily.  30 g  0  . sertraline (ZOLOFT) 50 MG tablet Take 1 tablet (50 mg total) by mouth daily.  90 tablet  2  . warfarin (COUMADIN) 4 MG tablet Take 1 tablet by mouth Daily. 4.5 mg on M,T, W  and 4 mg on Th,Fri,Sat,Sun        Past Surgical History  Procedure Date  . Tonsillectomy 1944  . Rotator cuff repair 2004  . Tibia fracture surgery 2006  . Uterine suspension     Allergies  Allergen Reactions  . Ibuprofen Rash    History   Social History  . Marital Status:  Single    Spouse Name: N/A    Number of Children: 1  . Years of Education: hs grad   Occupational History  . Not on file.   Social History Main Topics  . Smoking status: Never Smoker   . Smokeless tobacco: Never Used  . Alcohol Use: Yes     socially  . Drug Use: No  . Sexually Active: Not Currently   Other Topics Concern  . Not on file   Social History Narrative  . No narrative on file    Family History  Problem Relation Age of Onset  . Cancer Mother     colon  . Hypertension Mother   . Parkinsonism Father   . Hypertension Father   . Alzheimer's disease Brother   . Diabetes Neg Hx   . Heart attack Neg Hx   . Hyperlipidemia Neg Hx   . Sudden death Neg Hx     BP 135/78  Pulse 97  Temp 97.9 F (36.6 C) (Oral)  Ht 5\' 5"  (1.651 m)  Wt 130 lb (58.968 kg)  BMI 21.63 kg/m2  Review of Systems See HPI above.    Objective:   Physical Exam Gen: NAD  Neck: No gross deformity, swelling, bruising.  Spasms right cervical paraspinal region. TTP  right cervical paraspinal region, trapezius.  No midline/bony TTP. Only 10 degrees extension- full flexion and rotations - pain on full bilateral rotations. BUE strength 5/5.   Sensation intact to light touch.   2+ equal reflexes in triceps, biceps, brachioradialis tendons. Negative spurlings. NV intact distal BUEs.  R shoulder: No swelling, ecchymoses.  No gross deformity. No TTP. FROM. Negative Hawkins, Neers. Negative Speeds, Yergasons. Strength 5/5 with empty can and resisted internal/external rotation. Negative apprehension. NV intact distally.  L knee: No gross deformity, ecchymoses, swelling.  Well healed arthroscopy scars.   TTP medial joint line, less post patellar facets.  No lateral joint line TTP. FROM. Negative ant/post drawers. Negative valgus/varus testing. Negative lachmanns. Negative mcmurrays, apleys, patellar apprehension, clarkes. NV intact distally.    Assessment & Plan:  1. Cervical  radiculopathy - Patient is on coumadin so will avoid prednisone and NSAIDs for now - to discuss with cardiologist if she wants to consider these.  Start formal PT, tramadol as needed for pain.  Heat prn spasms.  Ergonomic issues discussed.  If not improving will consider MRI, cervical collar, adjunctive medications.  2. Left knee pain - 2/2 DJD.  Intraarticular injection given today.  Tylenol, tramadol.  Consider glucosamine and capsaicin.  Viscosupplementation if not improving with cortisone injections.  See instructions for further.  After informed written consent, patient was seated on exam table. Left knee was prepped with alcohol swab and utilizing anteromedial approach, patient's left knee was injected intraarticularly with 3:1 marcaine: depomedrol. Patient tolerated the procedure well without immediate complications.

## 2012-02-14 NOTE — Assessment & Plan Note (Signed)
Patient is on coumadin so will avoid prednisone and NSAIDs for now - to discuss with cardiologist if she wants to consider these.  Start formal PT, tramadol as needed for pain.  Heat prn spasms.  Ergonomic issues discussed.  If not improving will consider MRI, cervical collar, adjunctive medications.

## 2012-03-02 ENCOUNTER — Ambulatory Visit: Payer: Medicare PPO | Admitting: Physical Therapy

## 2012-03-19 ENCOUNTER — Ambulatory Visit: Payer: Medicare PPO | Admitting: Family Medicine

## 2012-03-25 ENCOUNTER — Ambulatory Visit: Payer: Medicare PPO | Attending: Family Medicine | Admitting: Rehabilitation

## 2012-04-20 ENCOUNTER — Other Ambulatory Visit: Payer: Self-pay | Admitting: Internal Medicine

## 2012-04-20 ENCOUNTER — Other Ambulatory Visit: Payer: Self-pay | Admitting: *Deleted

## 2012-04-20 DIAGNOSIS — F329 Major depressive disorder, single episode, unspecified: Secondary | ICD-10-CM

## 2012-04-20 MED ORDER — SERTRALINE HCL 50 MG PO TABS: 50.0000 mg | ORAL_TABLET | Freq: Every day | ORAL | Status: DC

## 2012-04-20 NOTE — Telephone Encounter (Signed)
Refill request for coumadin.  

## 2012-04-20 NOTE — Telephone Encounter (Signed)
Awaiting return call regarding pt coumadin rx

## 2012-04-20 NOTE — Telephone Encounter (Signed)
Felicia Lara  Call pt and let her know that I filled her Zoloft RX but she needs to get her Coumadin refill from her cardiologist her moniters her INR    I do not prescribe her Coumadin

## 2012-04-21 ENCOUNTER — Other Ambulatory Visit: Payer: Self-pay | Admitting: Internal Medicine

## 2012-07-01 ENCOUNTER — Ambulatory Visit: Payer: Medicare PPO | Admitting: Internal Medicine

## 2012-07-01 DIAGNOSIS — F329 Major depressive disorder, single episode, unspecified: Secondary | ICD-10-CM

## 2012-07-02 ENCOUNTER — Telehealth: Payer: Self-pay | Admitting: Internal Medicine

## 2012-08-31 ENCOUNTER — Other Ambulatory Visit: Payer: Self-pay | Admitting: Internal Medicine

## 2012-08-31 ENCOUNTER — Encounter: Payer: Self-pay | Admitting: Internal Medicine

## 2012-08-31 ENCOUNTER — Ambulatory Visit (INDEPENDENT_AMBULATORY_CARE_PROVIDER_SITE_OTHER): Payer: Medicare PPO | Admitting: Internal Medicine

## 2012-08-31 VITALS — BP 120/72 | HR 91 | Temp 97.5°F | Resp 16 | Ht 66.0 in | Wt 146.0 lb

## 2012-08-31 DIAGNOSIS — L299 Pruritus, unspecified: Secondary | ICD-10-CM

## 2012-08-31 DIAGNOSIS — F329 Major depressive disorder, single episode, unspecified: Secondary | ICD-10-CM

## 2012-08-31 DIAGNOSIS — I4891 Unspecified atrial fibrillation: Secondary | ICD-10-CM

## 2012-08-31 DIAGNOSIS — Z139 Encounter for screening, unspecified: Secondary | ICD-10-CM

## 2012-08-31 DIAGNOSIS — J40 Bronchitis, not specified as acute or chronic: Secondary | ICD-10-CM

## 2012-08-31 DIAGNOSIS — Z23 Encounter for immunization: Secondary | ICD-10-CM

## 2012-08-31 LAB — CBC WITH DIFFERENTIAL/PLATELET
Eosinophils Relative: 5 % (ref 0–5)
HCT: 37.7 % (ref 36.0–46.0)
Hemoglobin: 12.4 g/dL (ref 12.0–15.0)
Lymphocytes Relative: 25 % (ref 12–46)
Lymphs Abs: 1.4 10*3/uL (ref 0.7–4.0)
MCV: 77.4 fL — ABNORMAL LOW (ref 78.0–100.0)
Monocytes Absolute: 0.5 10*3/uL (ref 0.1–1.0)
Monocytes Relative: 9 % (ref 3–12)
Neutro Abs: 3.2 10*3/uL (ref 1.7–7.7)
RBC: 4.87 MIL/uL (ref 3.87–5.11)
RDW: 15.2 % (ref 11.5–15.5)
WBC: 5.4 10*3/uL (ref 4.0–10.5)

## 2012-08-31 LAB — LIPID PANEL
Cholesterol: 178 mg/dL (ref 0–200)
HDL: 37 mg/dL — ABNORMAL LOW (ref >39)
Total CHOL/HDL Ratio: 4.8 Ratio

## 2012-08-31 LAB — COMPREHENSIVE METABOLIC PANEL
Albumin: 4 g/dL (ref 3.5–5.2)
BUN: 15 mg/dL (ref 6–23)
CO2: 22 mEq/L (ref 19–32)
Calcium: 9.2 mg/dL (ref 8.4–10.5)
Chloride: 105 mEq/L (ref 96–112)
Creat: 0.91 mg/dL (ref 0.50–1.10)
Glucose, Bld: 98 mg/dL (ref 70–99)
Potassium: 4 mEq/L (ref 3.5–5.3)

## 2012-08-31 LAB — TSH: TSH: 0.32 u[IU]/mL — ABNORMAL LOW (ref 0.350–4.500)

## 2012-08-31 MED ORDER — HYDROXYZINE HCL 10 MG PO TABS: 10.0000 mg | ORAL_TABLET | Freq: Three times a day (TID) | ORAL | Status: DC | PRN

## 2012-08-31 MED ORDER — SERTRALINE HCL 100 MG PO TABS: 100.0000 mg | ORAL_TABLET | Freq: Every day | ORAL | Status: DC

## 2012-08-31 MED ORDER — AZITHROMYCIN 250 MG PO TABS: ORAL_TABLET | ORAL | Status: DC

## 2012-08-31 MED ORDER — HYDROCORTISONE 2.5 % EX CREA: TOPICAL_CREAM | Freq: Two times a day (BID) | CUTANEOUS | Status: AC

## 2012-08-31 NOTE — Patient Instructions (Addendum)
Get SARNA  OTC lotion for itching  Labs will be mailed to you

## 2012-08-31 NOTE — Progress Notes (Signed)
Subjective:    Patient ID: Felicia Lara, female    DOB: 1933-11-08, 80 y.o.   MRN: [id number]  HPI Felicia Lara is here for follow up.  Coughing up green sputum in am for the last 5 days. No fever no chest pain  Itching "all over" .  No rash and itcing only occurs at night.  No one in house has rash or scabies.    She states mood is OK but would like to go up on zoloft.  No suicdical thought or ideation.  "Good days and bad days"   She now has 7 grand-children.  Stress in household      Allergies  Allergen Reactions  . Ibuprofen Rash   Past Medical History  Diagnosis Date  . Atrial fibrillation   . Herpes zoster   . DVT (deep venous thrombosis)     per pt history  . Arthritis   . Anemia     per pt history  . Osteoporosis   . Depression    Past Surgical History  Procedure Date  . Tonsillectomy 1944  . Rotator cuff repair 2004  . Tibia fracture surgery 2006  . Uterine suspension    History   Social History  . Marital Status: Single    Spouse Name: N/A    Number of Children: 1  . Years of Education: hs grad   Occupational History  . Not on file.   Social History Main Topics  . Smoking status: Never Smoker   . Smokeless tobacco: Never Used  . Alcohol Use: Yes     Comment: socially  . Drug Use: No  . Sexually Active: Not Currently   Other Topics Concern  . Not on file   Social History Narrative  . No narrative on file   Family History  Problem Relation Age of Onset  . Cancer Mother     colon  . Hypertension Mother   . Parkinsonism Father   . Hypertension Father   . Alzheimer's disease Brother   . Diabetes Neg Hx   . Heart attack Neg Hx   . Hyperlipidemia Neg Hx   . Sudden death Neg Hx    Patient Active Problem List  Diagnosis  . Atrial fibrillation  . Arthritis  . Anemia  . Herpes zoster  . DVT (deep venous thrombosis)  . FHx: colon cancer  . Osteoporosis  . Depression  . Cervical radicular pain  . Left knee pain   Current Outpatient  Prescriptions on File Prior to Visit  Medication Sig Dispense Refill  . apixaban (ELIQUIS) 5 MG TABS tablet Take 5 mg by mouth 2 (two) times daily.      . sertraline (ZOLOFT) 50 MG tablet TAKE ONE TABLET BY MOUTH EVERY DAY  90 tablet  3      Review of Systems    see HPI Objective:   Physical Exam  Physical Exam  Nursing note and vitals reviewed.  Constitutional: She is oriented to person, place, and time. She appears well-developed and well-nourished.  HENT:  Head: Normocephalic and atraumatic.  Cardiovascular: Normal rate and regular rhythm. Exam reveals no gallop and no friction rub.  No murmur heard.  Pulmonary/Chest: Breath sounds normal. She has no wheezes. She has no rales.  Neurological: She is alert and oriented to person, place, and time.  Skin: Skin is warm and dry.  I do not see any rash on pt.  Psychiatric: She has a normal mood and affect. Her behavior is normal.  Assessment & Plan:   Itching OK for vistaril 10 mg tid OK for HC creme 2.5% bid to skin    Sarna lotion OTC  Depression  OK to increase to 100 mg Zolloft daily  Afib/DVT  No on Eliquis per Dr. Judithe Modest  Schedule CPE with me  Will give flu and influenza today

## 2012-09-03 ENCOUNTER — Telehealth: Payer: Self-pay | Admitting: *Deleted

## 2012-09-03 LAB — T3, FREE: T3, Free: 3.5 pg/mL (ref 2.3–4.2)

## 2012-09-03 NOTE — Telephone Encounter (Signed)
Free T3 and T 4 added to original labs

## 2012-09-07 ENCOUNTER — Telehealth: Payer: Self-pay | Admitting: Internal Medicine

## 2012-09-20 ENCOUNTER — Telehealth: Payer: Self-pay | Admitting: Internal Medicine

## 2012-09-20 NOTE — Telephone Encounter (Signed)
Called pt on mobile and home phone  No answer (kept ringning)on home phone  Cell phone is gransdons message  Left message to have pt contact office to discuss lab tests

## 2012-09-29 ENCOUNTER — Encounter: Payer: Self-pay | Admitting: Internal Medicine

## 2012-09-29 ENCOUNTER — Telehealth: Payer: Self-pay | Admitting: Internal Medicine

## 2012-09-29 NOTE — Telephone Encounter (Signed)
Certified letter to be sent on Wednesday march 5th

## 2012-10-12 ENCOUNTER — Encounter: Payer: Self-pay | Admitting: Internal Medicine

## 2012-10-12 ENCOUNTER — Ambulatory Visit (INDEPENDENT_AMBULATORY_CARE_PROVIDER_SITE_OTHER): Payer: Medicare PPO | Admitting: Internal Medicine

## 2012-10-12 VITALS — BP 152/85 | HR 92 | Temp 97.6°F | Resp 16 | Wt 143.0 lb

## 2012-10-12 DIAGNOSIS — E0590 Thyrotoxicosis, unspecified without thyrotoxic crisis or storm: Secondary | ICD-10-CM

## 2012-10-12 DIAGNOSIS — R946 Abnormal results of thyroid function studies: Secondary | ICD-10-CM

## 2012-10-12 DIAGNOSIS — F329 Major depressive disorder, single episode, unspecified: Secondary | ICD-10-CM

## 2012-10-12 DIAGNOSIS — R7989 Other specified abnormal findings of blood chemistry: Secondary | ICD-10-CM

## 2012-10-12 LAB — TSH: TSH: 0.013 u[IU]/mL — ABNORMAL LOW (ref 0.350–4.500)

## 2012-10-12 MED ORDER — SERTRALINE HCL 100 MG PO TABS: 100.0000 mg | ORAL_TABLET | Freq: Every day | ORAL | Status: DC

## 2012-10-12 MED ORDER — AZITHROMYCIN 250 MG PO TABS: ORAL_TABLET | ORAL | Status: DC

## 2012-10-12 NOTE — Addendum Note (Signed)
Addended by: Raechel Chute D on: 10/12/2012 10:51 AM   Modules accepted: Orders

## 2012-10-12 NOTE — Progress Notes (Signed)
Subjective:    Patient ID: Felicia Lara, female    DOB: 1934/01/28, 79 y.o.   MRN: [id number]  HPI  Dhanvi is here with daughter in law Lupita Leash.    See labs.  She does note dry skin,   Heart racing at times.  No visual changes.   She also has productive cough of white sputum.  No fever no chest pain no SOB  Allergies  Allergen Reactions  . Ibuprofen Rash   Past Medical History  Diagnosis Date  . Atrial fibrillation   . Herpes zoster   . DVT (deep venous thrombosis)     per pt history  . Arthritis   . Anemia     per pt history  . Osteoporosis   . Depression    Past Surgical History  Procedure Laterality Date  . Tonsillectomy  1944  . Rotator cuff repair  2004  . Tibia fracture surgery  2006  . Uterine suspension     History   Social History  . Marital Status: Single    Spouse Name: N/A    Number of Children: 1  . Years of Education: hs grad   Occupational History  . Not on file.   Social History Main Topics  . Smoking status: Never Smoker   . Smokeless tobacco: Never Used  . Alcohol Use: Yes     Comment: socially  . Drug Use: No  . Sexually Active: Not Currently   Other Topics Concern  . Not on file   Social History Narrative  . No narrative on file   Family History  Problem Relation Age of Onset  . Cancer Mother     colon  . Hypertension Mother   . Parkinsonism Father   . Hypertension Father   . Alzheimer's disease Brother   . Diabetes Neg Hx   . Heart attack Neg Hx   . Hyperlipidemia Neg Hx   . Sudden death Neg Hx    Patient Active Problem List  Diagnosis  . Atrial fibrillation  . Arthritis  . Anemia  . Herpes zoster  . DVT (deep venous thrombosis)  . FHx: colon cancer  . Osteoporosis  . Depression  . Cervical radicular pain  . Left knee pain  . Itching  . Subclinical hyperthyroidism   Current Outpatient Prescriptions on File Prior to Visit  Medication Sig Dispense Refill  . apixaban (ELIQUIS) 5 MG TABS tablet Take 5 mg by mouth 2  (two) times daily.      . hydrocortisone 2.5 % cream Apply topically 2 (two) times daily.  30 g  1  . hydrOXYzine (ATARAX/VISTARIL) 10 MG tablet Take 1 tablet (10 mg total) by mouth 3 (three) times daily as needed for itching.  30 tablet  0  . sertraline (ZOLOFT) 100 MG tablet Take 1 tablet (100 mg total) by mouth daily.  30 tablet  3   No current facility-administered medications on file prior to visit.      Review of Systems See HPI    Objective:   Physical Exam Physical Exam  Nursing note and vitals reviewed.  Constitutional: She is oriented to person, place, and time. She appears well-developed and well-nourished. She is cooperative.  HENT:  Head: Normocephalic and atraumatic.  Nose: Mucosal edema present.  Eyes: Conjunctivae and EOM are normal. Pupils are equal, round, and reactive to light.  Neck: Neck supple.  Cardiovascular: Regular rhythm, normal heart sounds, intact distal pulses and normal pulses. Exam reveals no gallop and no friction  rub.  No murmur heard.  Pulmonary/Chest: She has no wheezes. She has rhonchi. She has no rales.  Neurological: She is alert and oriented to person, place, and time.  Skin: Skin is warm and dry. No abrasion, no bruising, no ecchymosis and no rash noted. No cyanosis. Nails show no clubbing.  Psychiatric: She has a normal mood and affect. Her speech is normal and behavior is normal.            Assessment & Plan:  subclinical hyperthyroidism  Will refer to endocrinology  Bronchitis  Will give z pack  Cough  OTC delsym

## 2012-10-31 ENCOUNTER — Encounter: Payer: Self-pay | Admitting: Internal Medicine

## 2012-10-31 DIAGNOSIS — E0590 Thyrotoxicosis, unspecified without thyrotoxic crisis or storm: Secondary | ICD-10-CM | POA: Insufficient documentation

## 2012-11-19 ENCOUNTER — Encounter: Payer: Medicare PPO | Admitting: Internal Medicine

## 2012-11-19 DIAGNOSIS — Z0000 Encounter for general adult medical examination without abnormal findings: Secondary | ICD-10-CM

## 2013-01-19 IMAGING — CR DG RIBS W/ CHEST 3+V*L*
3 series · 3 of 3 positions shown · non-contrast
Comparison: None

CLINICAL DATA: Flank pain.  Left anterior lower rib pain.

LEFT RIBS AND CHEST - 3+ VIEW

[w chest pa]
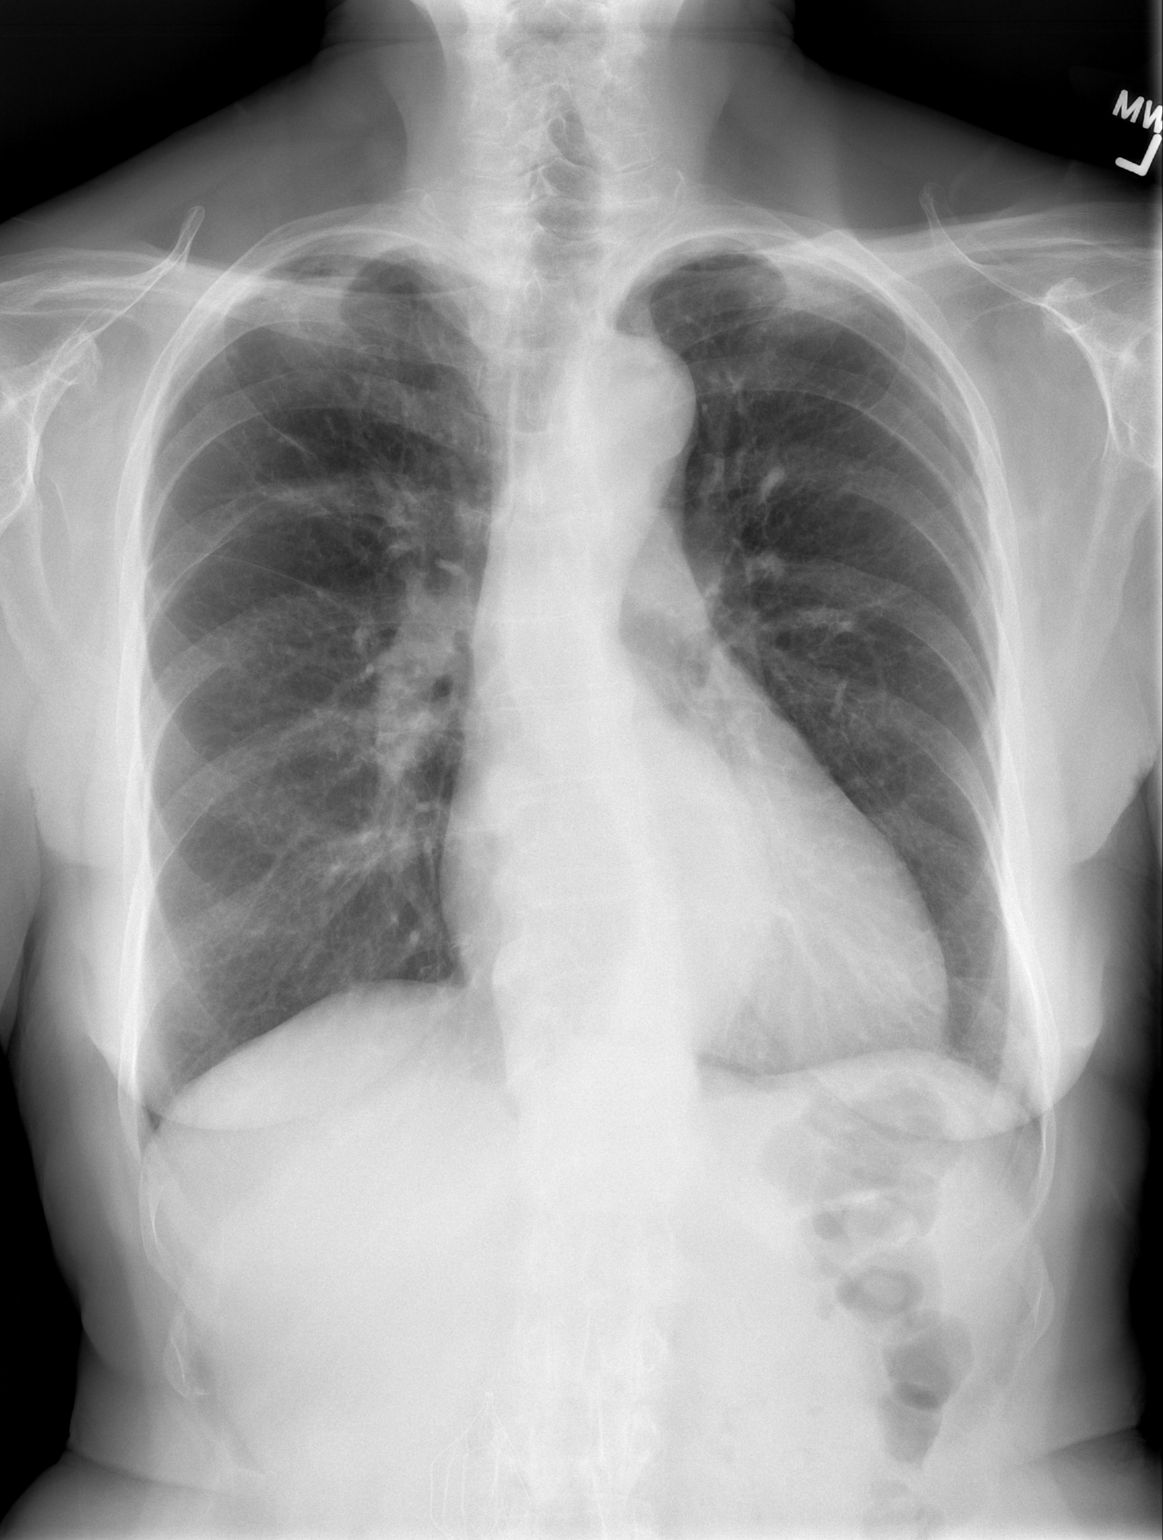

[w ribs ap/pa upper left]
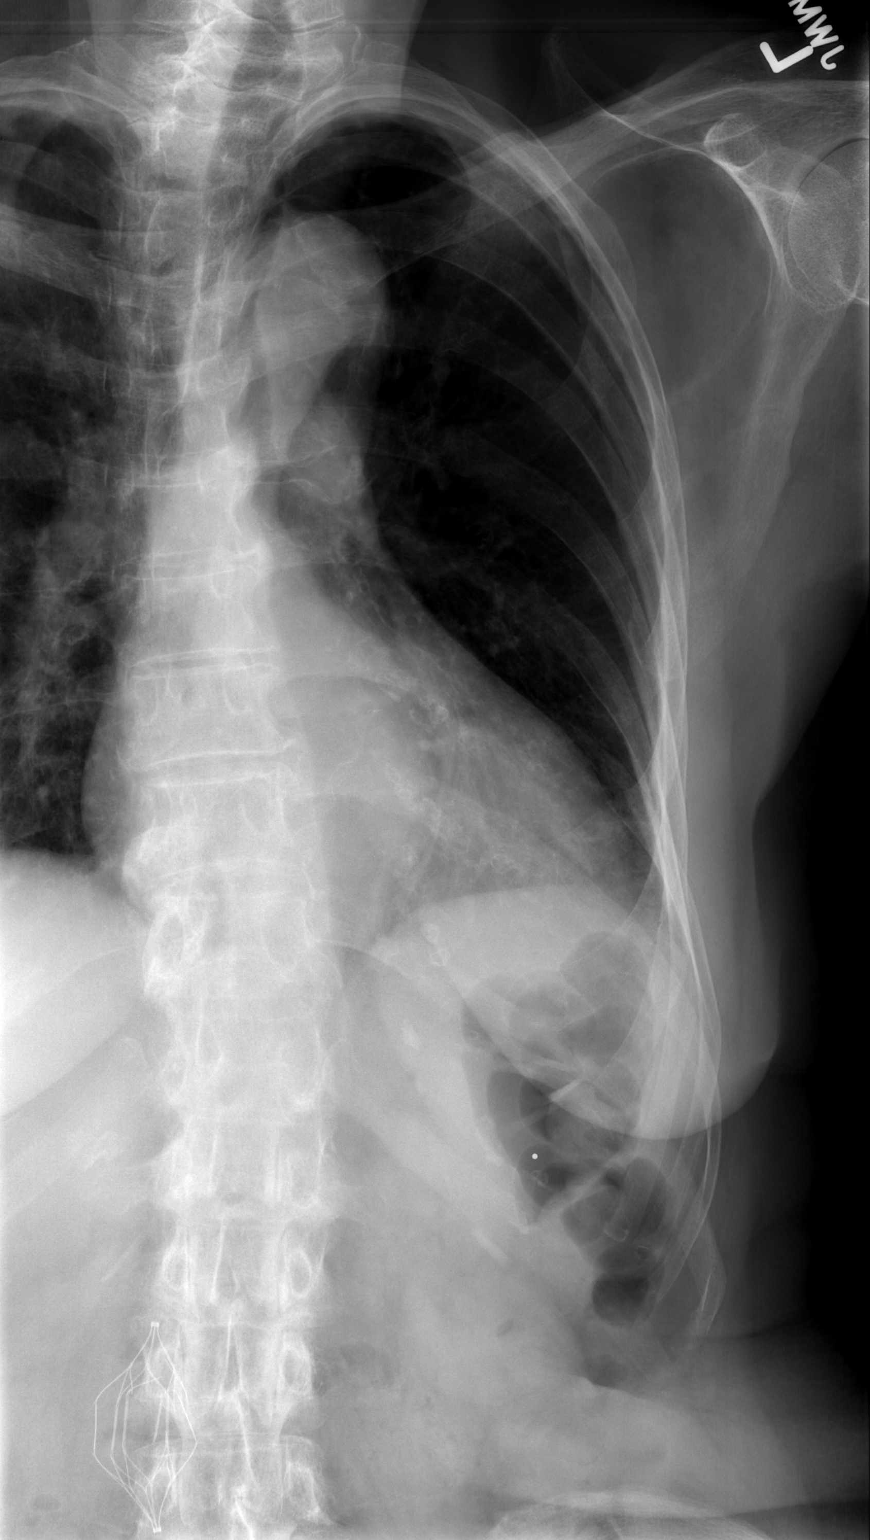

[w ribs oblique left]
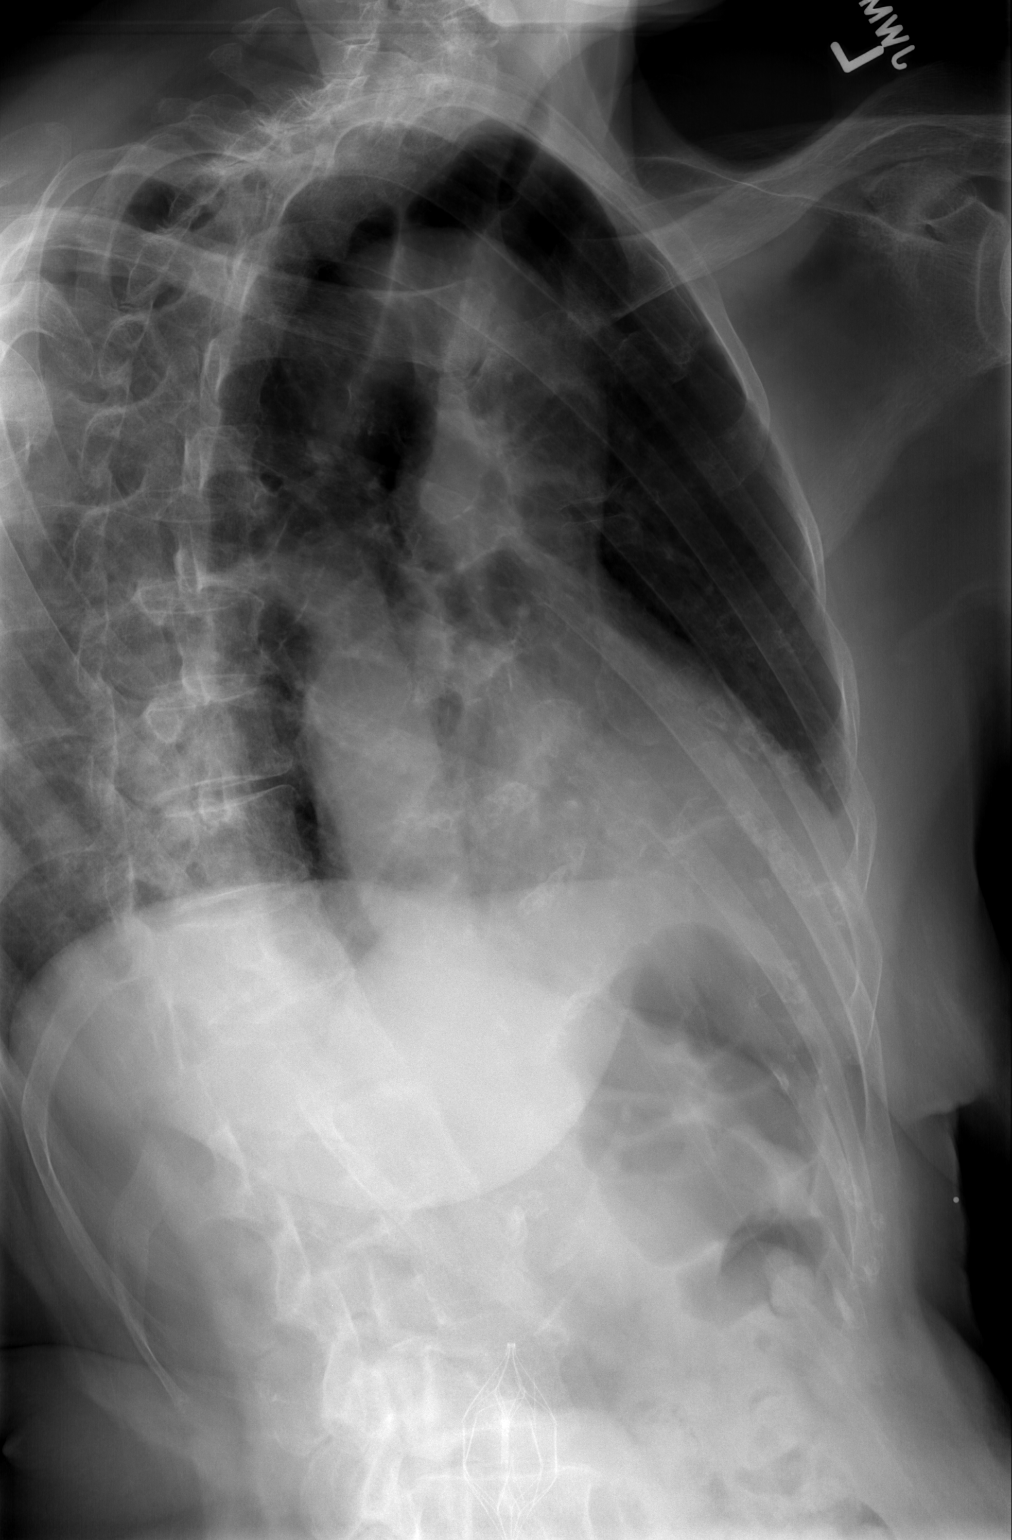

[3 of 3 positions shown; findings below may reference images not displayed]

FINDINGS: Heart size is normal.

No pleural effusion or pulmonary edema.

No airspace consolidation identified.

Coned-down views of the left ribs show no displaced rib fractures
identified.
IMPRESSION: 1.  No acute findings.

## 2013-03-08 ENCOUNTER — Other Ambulatory Visit: Payer: Self-pay | Admitting: *Deleted

## 2013-03-08 MED ORDER — SERTRALINE HCL 100 MG PO TABS: 100.0000 mg | ORAL_TABLET | Freq: Every day | ORAL | Status: DC

## 2013-03-08 NOTE — Telephone Encounter (Signed)
Refill request

## 2013-03-09 NOTE — Telephone Encounter (Signed)
Felicia Lara  Call pt and verify what dose of Ibuprofen she needs.  Advise her to schedule an office visit with me sometime in next 1-2 months

## 2013-03-11 NOTE — Telephone Encounter (Signed)
Felicia Lara  Call pt to verify  Allergy or not  Route back to me

## 2013-03-11 NOTE — Telephone Encounter (Signed)
See note in routing comments

## 2013-10-21 ENCOUNTER — Ambulatory Visit: Payer: Medicare PPO | Admitting: Internal Medicine

## 2013-10-28 ENCOUNTER — Ambulatory Visit: Payer: Medicare PPO | Admitting: Internal Medicine

## 2014-04-18 NOTE — Telephone Encounter (Signed)
error 

## 2014-09-11 ENCOUNTER — Emergency Department (HOSPITAL_BASED_OUTPATIENT_CLINIC_OR_DEPARTMENT_OTHER)
Admission: EM | Admit: 2014-09-11 | Discharge: 2014-09-11 | Disposition: A | Discharge status: Home / Self Care | Payer: Medicare PPO | Attending: Emergency Medicine | Admitting: Emergency Medicine

## 2014-09-11 ENCOUNTER — Encounter (HOSPITAL_BASED_OUTPATIENT_CLINIC_OR_DEPARTMENT_OTHER): Payer: Self-pay

## 2014-09-11 DIAGNOSIS — Y9389 Activity, other specified: Secondary | ICD-10-CM | POA: Diagnosis not present

## 2014-09-11 DIAGNOSIS — G629 Polyneuropathy, unspecified: Secondary | ICD-10-CM | POA: Insufficient documentation

## 2014-09-11 DIAGNOSIS — Z23 Encounter for immunization: Secondary | ICD-10-CM | POA: Diagnosis not present

## 2014-09-11 DIAGNOSIS — Y9289 Other specified places as the place of occurrence of the external cause: Secondary | ICD-10-CM | POA: Insufficient documentation

## 2014-09-11 DIAGNOSIS — Z8701 Personal history of pneumonia (recurrent): Secondary | ICD-10-CM | POA: Insufficient documentation

## 2014-09-11 DIAGNOSIS — Z8739 Personal history of other diseases of the musculoskeletal system and connective tissue: Secondary | ICD-10-CM | POA: Insufficient documentation

## 2014-09-11 DIAGNOSIS — Y288XXA Contact with other sharp object, undetermined intent, initial encounter: Secondary | ICD-10-CM | POA: Diagnosis not present

## 2014-09-11 DIAGNOSIS — Z792 Long term (current) use of antibiotics: Secondary | ICD-10-CM | POA: Insufficient documentation

## 2014-09-11 DIAGNOSIS — Y998 Other external cause status: Secondary | ICD-10-CM | POA: Diagnosis not present

## 2014-09-11 DIAGNOSIS — Z79899 Other long term (current) drug therapy: Secondary | ICD-10-CM | POA: Insufficient documentation

## 2014-09-11 DIAGNOSIS — S91115A Laceration without foreign body of left lesser toe(s) without damage to nail, initial encounter: Secondary | ICD-10-CM | POA: Insufficient documentation

## 2014-09-11 DIAGNOSIS — S91119A Laceration without foreign body of unspecified toe without damage to nail, initial encounter: Secondary | ICD-10-CM

## 2014-09-11 DIAGNOSIS — Z862 Personal history of diseases of the blood and blood-forming organs and certain disorders involving the immune mechanism: Secondary | ICD-10-CM | POA: Insufficient documentation

## 2014-09-11 DIAGNOSIS — Z8679 Personal history of other diseases of the circulatory system: Secondary | ICD-10-CM | POA: Diagnosis not present

## 2014-09-11 DIAGNOSIS — Z7952 Long term (current) use of systemic steroids: Secondary | ICD-10-CM | POA: Insufficient documentation

## 2014-09-11 DIAGNOSIS — Z86718 Personal history of other venous thrombosis and embolism: Secondary | ICD-10-CM | POA: Insufficient documentation

## 2014-09-11 DIAGNOSIS — F329 Major depressive disorder, single episode, unspecified: Secondary | ICD-10-CM | POA: Diagnosis not present

## 2014-09-11 DIAGNOSIS — S99922A Unspecified injury of left foot, initial encounter: Secondary | ICD-10-CM | POA: Diagnosis present

## 2014-09-11 HISTORY — DX: Polyneuropathy, unspecified: G62.9

## 2014-09-11 MED ORDER — TETANUS-DIPHTH-ACELL PERTUSSIS 5-2.5-18.5 LF-MCG/0.5 IM SUSP
0.5000 mL | Freq: Once | INTRAMUSCULAR | Status: AC
  Administered 2014-09-11: 0.5 mL via INTRAMUSCULAR
  Filled 2014-09-11: qty 0.5

## 2014-09-11 NOTE — ED Provider Notes (Signed)
Stepped on metal part of bed frame 25 hours ago - had acute onset of laceration / skin tear to the plantar aspect of the L third proximal toe - no drainage, no bleeding, ongoing pain.  On exam with superficial skin tear / lac.  Small am;t of bruising,  Tet UTD, lac will be deferred 2/2 length of time since injury, f/u with PMD.  Medical screening examination/treatment/procedure(s) were conducted as a shared visit with non-physician practitioner(s) and myself.  I personally evaluated the patient during the encounter.  Clinical Impression:   Final diagnoses:  Toe laceration, initial encounter         Vida RollerBrian D Camaria Gerald, MD 09/11/14 2027

## 2014-09-11 NOTE — ED Notes (Signed)
Pt reports hitting toe on bed frame that was broken yesterday. Pt has laceration to bottom of 3rd toe on left foot Reports caregiver washed it and dressed it yesterday but patient is on blood thinners.

## 2014-09-11 NOTE — ED Provider Notes (Signed)
CSN: [id number]     Arrival date & time 09/11/14  1512 History   First MD Initiated Contact with Patient 09/11/14 1520     Chief Complaint  Patient presents with  . Foot Pain     (Consider location/radiation/quality/duration/timing/severity/associated sxs/prior Treatment) HPI Comments: Patient presents with complaint of a left third toe laceration sustained yesterday at approximately 3 PM. Patient cut the bottom part of her toe on a bed frame that was made of metal. Patient is on Eliquis and had initially a lot of bleeding. This was controlled with pressure and by reviewing the area. Patient complains of pain but is able to walk. Tetanus unknown. No other treatments prior to arrival. No drainage or discharge.  Patient is a 81 y.o. female presenting with lower extremity pain. The history is provided by the patient and a relative.  Foot Pain Pertinent negatives include no arthralgias, joint swelling, neck pain, numbness or weakness.    Past Medical History  Diagnosis Date  . Atrial fibrillation   . Herpes zoster   . DVT (deep venous thrombosis)     per pt history  . Arthritis   . Anemia     per pt history  . Osteoporosis   . Depression   . Neuropathy    Past Surgical History  Procedure Laterality Date  . Tonsillectomy  1944  . Rotator cuff repair  2004  . Tibia fracture surgery  2006  . Uterine suspension     Family History  Problem Relation Age of Onset  . Cancer Mother     colon  . Hypertension Mother   . Parkinsonism Father   . Hypertension Father   . Alzheimer's disease Brother   . Diabetes Neg Hx   . Heart attack Neg Hx   . Hyperlipidemia Neg Hx   . Sudden death Neg Hx    History  Substance Use Topics  . Smoking status: Never Smoker   . Smokeless tobacco: Never Used  . Alcohol Use: Yes     Comment: socially   OB History    No data available     Review of Systems  Constitutional: Negative for activity change.  Musculoskeletal: Negative for back pain,  joint swelling, arthralgias and neck pain.  Skin: Positive for color change and wound.  Neurological: Negative for weakness and numbness.      Allergies  Ibuprofen  Home Medications   Prior to Admission medications   Medication Sig Start Date End Date Taking? Authorizing Provider  GABAPENTIN, ONCE-DAILY, PO Take by mouth.   Yes Historical Provider, MD  UNABLE TO FIND Med Name:    Yes Historical Provider, MD  UNKNOWN TO PATIENT    Yes Historical Provider, MD  apixaban (ELIQUIS) 5 MG TABS tablet Take 5 mg by mouth 2 (two) times daily.    Historical Provider, MD  azithromycin (ZITHROMAX) 250 MG tablet Take as directed 10/12/12   Kendrick Rancheborah D Schoenhoff, MD  hydrocortisone 2.5 % cream Apply topically 2 (two) times daily. 08/31/12   Kendrick Rancheborah D Schoenhoff, MD  hydrOXYzine (ATARAX/VISTARIL) 10 MG tablet Take 1 tablet (10 mg total) by mouth 3 (three) times daily as needed for itching. 08/31/12   Kendrick Rancheborah D Schoenhoff, MD  sertraline (ZOLOFT) 100 MG tablet Take 1 tablet (100 mg total) by mouth daily. 03/08/13   Kendrick Rancheborah D Schoenhoff, MD   BP 134/81 mmHg  Pulse 83  Temp(Src) 97.8 F (36.6 C) (Oral)  Resp 18  Ht 5\' 7"  (1.702 m)  Wt 160 lb (  72.576 kg)  BMI 25.05 kg/m2  SpO2 97% Physical Exam  Constitutional: She appears well-developed and well-nourished.  HENT:  Head: Normocephalic and atraumatic.  Eyes: Pupils are equal, round, and reactive to light.  Neck: Normal range of motion. Neck supple.  Cardiovascular: Exam reveals no decreased pulses.   Musculoskeletal: She exhibits tenderness. She exhibits no edema.       Right knee: Normal.       Right ankle: Normal. No tenderness.       Right lower leg: Normal.       Right foot: There is tenderness. There is normal range of motion, no bony tenderness and no swelling.  Patient with 1cm superficial laceration on underside of L 3rd toe at MTP. It appears hemostatic and clean. No tendon involvement noted. There is generalized bruising over dorsum of  entire toe.   Neurological: She is alert. No sensory deficit.  Motor, sensation, and vascular distal to the injury is fully intact.   Skin: Skin is warm and dry.  Psychiatric: She has a normal mood and affect.  Nursing note and vitals reviewed.   ED Course  Procedures (including critical care time) Labs Review Labs Reviewed - No data to display  Imaging Review No results found.   EKG Interpretation None       3:49 PM Patient seen and examined.    Vital signs reviewed and are as follows: BP 134/81 mmHg  Pulse 83  Temp(Src) 97.8 F (36.6 C) (Oral)  Resp 18  Ht  (1.702 m)  Wt 160 lb (72.576 kg)  BMI 25.05 kg/m2  SpO2 97%  Patient and family counseled on wound care and signs and symptoms of infection and when to return.  Wound dressed, buddy taping performed, patient provided with postop shoe.  MDM   Final diagnoses:  Toe laceration, initial encounter   Patient with toe laceration, given greater than 24 hours since injury will not close primarily. No history of immunocompromise. There is bruising of the toe. Cannot entirely rule out fracture however if one is present this would not change management. Patient is ambulatory. She is provided with symptomatic measures. Counseled on wound care and signs of infection and when to return.    Renne Crigler, PA-C 09/11/14 1615  Vida Roller, MD 09/11/14 2027

## 2014-09-11 NOTE — Discharge Instructions (Signed)
Please read and follow all provided instructions.  Your diagnoses today include:  1. Toe laceration, initial encounter     Tests performed today include:  Vital signs. See below for your results today.   Medications prescribed:   None  Take any prescribed medications only as directed.   Home care instructions:  Follow any educational materials and wound care instructions contained in this packet.   Keep affected area above the level of your heart when possible to minimize swelling. Wash area gently twice a day with warm soapy water and replace dressing. Apply antibiotic ointment each time. Do not apply alcohol or hydrogen peroxide. Use post-op shoe to help minimize flexion of toes while walking.   Return instructions:  Return to the Emergency Department if you have:  Fever  Worsening pain  Worsening swelling of the wound  Pus draining from the wound  Redness of the skin that moves away from the wound, especially if it streaks away from the affected area   Any other emergent concerns  Your vital signs today were: BP 134/81 mmHg   Pulse 83   Temp(Src) 97.8 F (36.6 C) (Oral)   Resp 18   Ht 5\' 7"  (1.702 m)   Wt 160 lb (72.576 kg)   BMI 25.05 kg/m2   SpO2 97% If your blood pressure (BP) was elevated above 135/85 this visit, please have this repeated by your doctor within one month. --------------

## 2015-02-19 ENCOUNTER — Encounter (HOSPITAL_BASED_OUTPATIENT_CLINIC_OR_DEPARTMENT_OTHER): Payer: Self-pay | Admitting: *Deleted

## 2015-02-19 ENCOUNTER — Emergency Department (HOSPITAL_BASED_OUTPATIENT_CLINIC_OR_DEPARTMENT_OTHER)
Admission: EM | Admit: 2015-02-19 | Discharge: 2015-02-19 | Disposition: A | Discharge status: Home / Self Care | Payer: Medicare HMO | Attending: Emergency Medicine | Admitting: Emergency Medicine

## 2015-02-19 ENCOUNTER — Emergency Department (HOSPITAL_BASED_OUTPATIENT_CLINIC_OR_DEPARTMENT_OTHER): Payer: Medicare HMO

## 2015-02-19 DIAGNOSIS — Z7902 Long term (current) use of antithrombotics/antiplatelets: Secondary | ICD-10-CM | POA: Diagnosis not present

## 2015-02-19 DIAGNOSIS — R600 Localized edema: Secondary | ICD-10-CM | POA: Diagnosis not present

## 2015-02-19 DIAGNOSIS — Z8619 Personal history of other infectious and parasitic diseases: Secondary | ICD-10-CM | POA: Insufficient documentation

## 2015-02-19 DIAGNOSIS — Z7952 Long term (current) use of systemic steroids: Secondary | ICD-10-CM | POA: Diagnosis not present

## 2015-02-19 DIAGNOSIS — Z86718 Personal history of other venous thrombosis and embolism: Secondary | ICD-10-CM | POA: Insufficient documentation

## 2015-02-19 DIAGNOSIS — F329 Major depressive disorder, single episode, unspecified: Secondary | ICD-10-CM | POA: Insufficient documentation

## 2015-02-19 DIAGNOSIS — G629 Polyneuropathy, unspecified: Secondary | ICD-10-CM | POA: Insufficient documentation

## 2015-02-19 DIAGNOSIS — M79604 Pain in right leg: Secondary | ICD-10-CM | POA: Diagnosis present

## 2015-02-19 DIAGNOSIS — Z862 Personal history of diseases of the blood and blood-forming organs and certain disorders involving the immune mechanism: Secondary | ICD-10-CM | POA: Insufficient documentation

## 2015-02-19 LAB — BASIC METABOLIC PANEL
ANION GAP: 7 (ref 5–15)
BUN: 32 mg/dL — ABNORMAL HIGH (ref 6–20)
CHLORIDE: 108 mmol/L (ref 101–111)
CO2: 26 mmol/L (ref 22–32)
Calcium: 8.9 mg/dL (ref 8.9–10.3)
Creatinine, Ser: 0.97 mg/dL (ref 0.44–1.00)
GFR calc non Af Amer: 55 mL/min — ABNORMAL LOW (ref >60)
Glucose, Bld: 88 mg/dL (ref 65–99)
POTASSIUM: 4.6 mmol/L (ref 3.5–5.1)
Sodium: 141 mmol/L (ref 135–145)

## 2015-02-19 LAB — CBC WITH DIFFERENTIAL/PLATELET
BASOS ABS: 0 10*3/uL (ref 0.0–0.1)
Band Neutrophils: 2 % (ref 0–10)
Basophils Relative: 1 % (ref 0–1)
EOS ABS: 0.1 10*3/uL (ref 0.0–0.7)
Eosinophils Relative: 3 % (ref 0–5)
HCT: 40.2 % (ref 36.0–46.0)
Hemoglobin: 13 g/dL (ref 12.0–15.0)
LYMPHS ABS: 1 10*3/uL (ref 0.7–4.0)
LYMPHS PCT: 20 % (ref 12–46)
MCH: 27.9 pg (ref 26.0–34.0)
MCHC: 32.3 g/dL (ref 30.0–36.0)
MCV: 86.3 fL (ref 78.0–100.0)
MONO ABS: 0.8 10*3/uL (ref 0.1–1.0)
Monocytes Relative: 16 % — ABNORMAL HIGH (ref 3–12)
NEUTROS PCT: 58 % (ref 43–77)
Neutro Abs: 2.9 10*3/uL (ref 1.7–7.7)
PLATELETS: 141 10*3/uL — AB (ref 150–400)
RBC: 4.66 MIL/uL (ref 3.87–5.11)
RDW: 14.1 % (ref 11.5–15.5)
WBC: 4.8 10*3/uL (ref 4.0–10.5)

## 2015-02-19 LAB — BRAIN NATRIURETIC PEPTIDE: B NATRIURETIC PEPTIDE 5: 187.2 pg/mL — AB (ref 0.0–100.0)

## 2015-02-19 MED ORDER — ACETAMINOPHEN 325 MG PO TABS
650.0000 mg | ORAL_TABLET | Freq: Once | ORAL | Status: AC
  Administered 2015-02-19: 650 mg via ORAL
  Filled 2015-02-19: qty 2

## 2015-02-19 MED ORDER — DOXYCYCLINE HYCLATE 100 MG PO CAPS: 100.0000 mg | ORAL_CAPSULE | Freq: Two times a day (BID) | ORAL | Status: DC

## 2015-02-19 MED ORDER — FUROSEMIDE 10 MG/ML IJ SOLN
40.0000 mg | Freq: Once | INTRAMUSCULAR | Status: AC
  Administered 2015-02-19: 40 mg via INTRAVENOUS
  Filled 2015-02-19: qty 4

## 2015-02-19 NOTE — ED Notes (Signed)
Patient awaiting transport back to facility by [facility]

## 2015-02-19 NOTE — ED Notes (Signed)
Pt brought  in by EMS for right lower leg pain x 5 days from [facility]

## 2015-02-19 NOTE — Discharge Instructions (Signed)
Peripheral Edema °You have swelling in your legs (peripheral edema). This swelling is due to excess accumulation of salt and water in your body. Edema may be a sign of heart, kidney or liver disease, or a side effect of a medication. It may also be due to problems in the leg veins. Elevating your legs and using special support stockings may be very helpful, if the cause of the swelling is due to poor venous circulation. Avoid long periods of standing, whatever the cause. °Treatment of edema depends on identifying the cause. Chips, pretzels, pickles and other salty foods should be avoided. Restricting salt in your diet is almost always needed. Water pills (diuretics) are often used to remove the excess salt and water from your body via urine. These medicines prevent the kidney from reabsorbing sodium. This increases urine flow. °Diuretic treatment may also result in lowering of potassium levels in your body. Potassium supplements may be needed if you have to use diuretics daily. Daily weights can help you keep track of your progress in clearing your edema. You should call your caregiver for follow up care as recommended. °SEEK IMMEDIATE MEDICAL CARE IF:  °· You have increased swelling, pain, redness, or heat in your legs. °· You develop shortness of breath, especially when lying down. °· You develop chest or abdominal pain, weakness, or fainting. °· You have a fever. °Document Released: 08/22/2004 Document Revised: 10/07/2011 Document Reviewed: 08/02/2009 °Exit[employer]® Patient Information ©2015 [employer]. This information is not intended to replace advice given to you by your health care provider. Make sure you discuss any questions you have with your health care provider. ° °

## 2015-02-19 NOTE — ED Provider Notes (Signed)
CSN: [id number]     Arrival date & time 02/19/15  1058 History   First MD Initiated Contact with Patient 02/19/15 1225     Chief Complaint  Patient presents with  . Leg Pain     (Consider location/radiation/quality/duration/timing/severity/associated sxs/prior Treatment) HPI Comments: Patient is brought in by EMS from [facility] place with leg pain and swelling. She states over the last 3-4 days she's had some swelling of both of her legs. She really only complains to me of the right one but when I ask her she says that both of her legs up and swollen. She does have a history of DVT and is on eloquent was currently. She can't remember what leg DVT was then. She denies any known fevers. She does note that they've been red. They're sore all over her legs. She denies any injuries or wounds. She denies any chest pain or shortness of breath. She states she was trying to keep him elevated but it has not helped with the swelling.  Patient is a 81 y.o. female presenting with leg pain.  Leg Pain Associated symptoms: no back pain, no fatigue and no fever     Past Medical History  Diagnosis Date  . Atrial fibrillation   . Herpes zoster   . DVT (deep venous thrombosis)     per pt history  . Arthritis   . Anemia     per pt history  . Osteoporosis   . Depression   . Neuropathy    Past Surgical History  Procedure Laterality Date  . Tonsillectomy  1944  . Rotator cuff repair  2004  . Tibia fracture surgery  2006  . Uterine suspension     Family History  Problem Relation Age of Onset  . Cancer Mother     colon  . Hypertension Mother   . Parkinsonism Father   . Hypertension Father   . Alzheimer's disease Brother   . Diabetes Neg Hx   . Heart attack Neg Hx   . Hyperlipidemia Neg Hx   . Sudden death Neg Hx    History  Substance Use Topics  . Smoking status: Never Smoker   . Smokeless tobacco: Never Used  . Alcohol Use: Yes     Comment: socially   OB History    No data available      Review of Systems  Constitutional: Negative for fever, chills, diaphoresis and fatigue.  HENT: Negative for congestion, rhinorrhea and sneezing.   Eyes: Negative.   Respiratory: Negative for cough, chest tightness and shortness of breath.   Cardiovascular: Positive for leg swelling. Negative for chest pain.  Gastrointestinal: Negative for nausea, vomiting, abdominal pain, diarrhea and blood in stool.  Genitourinary: Negative for frequency, hematuria, flank pain and difficulty urinating.  Musculoskeletal: Positive for myalgias. Negative for back pain and arthralgias.  Skin: Negative for rash.  Neurological: Negative for dizziness, speech difficulty, weakness, numbness and headaches.      Allergies  Ibuprofen  Home Medications   Prior to Admission medications   Medication Sig Start Date End Date Taking? Authorizing Provider  acetaminophen (TYLENOL) 500 MG tablet Take 500 mg by mouth every 6 (six) hours as needed.   Yes Historical Provider, MD  apixaban (ELIQUIS) 5 MG TABS tablet Take 5 mg by mouth 2 (two) times daily.   Yes Historical Provider, MD  GABAPENTIN, ONCE-DAILY, PO Take by mouth.   Yes Historical Provider, MD  sertraline (ZOLOFT) 100 MG tablet Take 1 tablet (100 mg total) by  mouth daily. 03/08/13  Yes Kendrick Ranch, MD  doxycycline (VIBRAMYCIN) 100 MG capsule Take 1 capsule (100 mg total) by mouth 2 (two) times daily. One po bid x 7 days 02/19/15   Rolan Bucco, MD  hydrocortisone 2.5 % cream Apply topically 2 (two) times daily. 08/31/12   Kendrick Ranch, MD  hydrOXYzine (ATARAX/VISTARIL) 10 MG tablet Take 1 tablet (10 mg total) by mouth 3 (three) times daily as needed for itching. 08/31/12   Kendrick Ranch, MD  UNABLE TO FIND Med Name:     Historical Provider, MD  UNKNOWN TO PATIENT     Historical Provider, MD   BP 133/84 mmHg  Pulse 96  Resp 14  Ht  (1.651 m)  Wt 175 lb (79.379 kg)  BMI 29.12 kg/m2  SpO2 100% Physical Exam  Constitutional:  She is oriented to person, place, and time. She appears well-developed and well-nourished.  HENT:  Head: Normocephalic and atraumatic.  Eyes: Pupils are equal, round, and reactive to light.  Neck: Normal range of motion. Neck supple.  Cardiovascular: Normal rate, regular rhythm and normal heart sounds.   Pulmonary/Chest: Effort normal and breath sounds normal. No respiratory distress. She has no wheezes. She has no rales. She exhibits no tenderness.  Abdominal: Soft. Bowel sounds are normal. There is no tenderness. There is no rebound and no guarding.  Musculoskeletal: Normal range of motion. She exhibits edema.  Bilateral pitting edema, left greater than right. Pedal pulses are intact. There is no wounds. There is some erythema to the pretibial areas of both of her legs up to the upper third of the tibia area. There some mild diffuse tenderness of both legs  Lymphadenopathy:    She has no cervical adenopathy.  Neurological: She is alert and oriented to person, place, and time.  Skin: Skin is warm and dry. No rash noted.  Psychiatric: She has a normal mood and affect.    ED Course  Procedures (including critical care time) Labs Review Labs Reviewed  BASIC METABOLIC PANEL - Abnormal; Notable for the following:    BUN 32 (*)    GFR calc non Af Amer 55 (*)    All other components within normal limits  CBC WITH DIFFERENTIAL/PLATELET - Abnormal; Notable for the following:    Platelets 141 (*)    Monocytes Relative 16 (*)    All other components within normal limits  BRAIN NATRIURETIC PEPTIDE - Abnormal; Notable for the following:    B Natriuretic Peptide 187.2 (*)    All other components within normal limits    Imaging Review US Venous Img Lower Bilateral  02/19/2015   CLINICAL DATA:  Bilateral calf swelling today.  History of DVT.  EXAM: BILATERAL LOWER EXTREMITY VENOUS DOPPLER ULTRASOUND  TECHNIQUE: Gray-scale sonography with graded compression, as well as color Doppler and duplex  ultrasound were performed to evaluate the lower extremity deep venous systems from the level of the common femoral vein and including the common femoral, femoral, profunda femoral, popliteal and calf veins including the posterior tibial, peroneal and gastrocnemius veins when visible. The superficial great saphenous vein was also interrogated. Spectral Doppler was utilized to evaluate flow at rest and with distal augmentation maneuvers in the common femoral, femoral and popliteal veins.  COMPARISON:  None.  FINDINGS: RIGHT LOWER EXTREMITY  Common Femoral Vein: No evidence of thrombus. Normal compressibility, respiratory phasicity and response to augmentation.  Saphenofemoral Junction: No evidence of thrombus. Normal compressibility and flow on color Doppler imaging.  Profunda Femoral Vein: No evidence of thrombus. Normal compressibility and flow on color Doppler imaging.  Femoral Vein: No evidence of thrombus. Normal compressibility, respiratory phasicity and response to augmentation.  Popliteal Vein: No evidence of thrombus. Normal compressibility, respiratory phasicity and response to augmentation.  Calf Veins: No evidence of thrombus. Normal compressibility and flow on color Doppler imaging.  Superficial Great Saphenous Vein: No evidence of thrombus. Normal compressibility and flow on color Doppler imaging.  Venous Reflux:  None.  Other Findings:  None.  LEFT LOWER EXTREMITY  Common Femoral Vein: No evidence of thrombus. Normal compressibility, respiratory phasicity and response to augmentation.  Saphenofemoral Junction: No evidence of thrombus. Normal compressibility and flow on color Doppler imaging.  Profunda Femoral Vein: No evidence of thrombus. Normal compressibility and flow on color Doppler imaging.  Femoral Vein: No evidence of thrombus. Normal compressibility, respiratory phasicity and response to augmentation.  Popliteal Vein: No evidence of thrombus. Normal compressibility, respiratory phasicity and  response to augmentation.  Calf Veins: No evidence of thrombus. Normal compressibility and flow on color Doppler imaging.  Superficial Great Saphenous Vein: No evidence of thrombus. Normal compressibility and flow on color Doppler imaging.  Venous Reflux:  None.  Other Findings:  None.  IMPRESSION: No evidence of deep venous thrombosis.   Electronically Signed   By: Drusilla Kanner M.D.   On: 02/19/2015 14:43     EKG Interpretation None     ED ECG REPORT   Date: 02/19/2015  Rate: 87  Rhythm: atrial fibrillation  QRS Axis: normal  Intervals: normal  ST/T Wave abnormalities: normal  Conduction Disutrbances:none  Narrative Interpretation:   Old EKG Reviewed: unchanged  I have personally reviewed the EKG tracing and agree with the computerized printout as noted.   MDM   Final diagnoses:  Pedal edema    Patient presents with bilateral leg swelling. There is no evidence of DVT. There is some mild erythema which could indicate some cellulitis although I feel that it's more likely just due to the edema. She has no evidence of pulmonary edema. She has normal oxygen saturations. Her lungs are clear. She was given a dose of Lasix in the ED and I will treat her with doxycycline as an outpatient. I instructed her to follow-up with her primary care physician at the nursing facility.    Rolan Bucco, MD 02/19/15 [phone]

## 2015-03-20 ENCOUNTER — Emergency Department (HOSPITAL_BASED_OUTPATIENT_CLINIC_OR_DEPARTMENT_OTHER): Payer: Medicare HMO

## 2015-03-20 ENCOUNTER — Encounter (HOSPITAL_BASED_OUTPATIENT_CLINIC_OR_DEPARTMENT_OTHER): Payer: Self-pay | Admitting: *Deleted

## 2015-03-20 ENCOUNTER — Emergency Department (HOSPITAL_BASED_OUTPATIENT_CLINIC_OR_DEPARTMENT_OTHER)
Admission: EM | Admit: 2015-03-20 | Discharge: 2015-03-20 | Disposition: A | Discharge status: Home / Self Care | Payer: Medicare HMO | Attending: Emergency Medicine | Admitting: Emergency Medicine

## 2015-03-20 DIAGNOSIS — Y9289 Other specified places as the place of occurrence of the external cause: Secondary | ICD-10-CM | POA: Insufficient documentation

## 2015-03-20 DIAGNOSIS — N390 Urinary tract infection, site not specified: Secondary | ICD-10-CM

## 2015-03-20 DIAGNOSIS — Z8669 Personal history of other diseases of the nervous system and sense organs: Secondary | ICD-10-CM | POA: Diagnosis not present

## 2015-03-20 DIAGNOSIS — R2243 Localized swelling, mass and lump, lower limb, bilateral: Secondary | ICD-10-CM | POA: Diagnosis not present

## 2015-03-20 DIAGNOSIS — Z043 Encounter for examination and observation following other accident: Secondary | ICD-10-CM | POA: Diagnosis present

## 2015-03-20 DIAGNOSIS — Z8619 Personal history of other infectious and parasitic diseases: Secondary | ICD-10-CM | POA: Insufficient documentation

## 2015-03-20 DIAGNOSIS — R42 Dizziness and giddiness: Secondary | ICD-10-CM

## 2015-03-20 DIAGNOSIS — R600 Localized edema: Secondary | ICD-10-CM

## 2015-03-20 DIAGNOSIS — Z862 Personal history of diseases of the blood and blood-forming organs and certain disorders involving the immune mechanism: Secondary | ICD-10-CM | POA: Insufficient documentation

## 2015-03-20 DIAGNOSIS — Y998 Other external cause status: Secondary | ICD-10-CM | POA: Diagnosis not present

## 2015-03-20 DIAGNOSIS — W19XXXA Unspecified fall, initial encounter: Secondary | ICD-10-CM

## 2015-03-20 DIAGNOSIS — Z8679 Personal history of other diseases of the circulatory system: Secondary | ICD-10-CM | POA: Diagnosis not present

## 2015-03-20 DIAGNOSIS — Z8659 Personal history of other mental and behavioral disorders: Secondary | ICD-10-CM | POA: Insufficient documentation

## 2015-03-20 DIAGNOSIS — Z86718 Personal history of other venous thrombosis and embolism: Secondary | ICD-10-CM | POA: Insufficient documentation

## 2015-03-20 DIAGNOSIS — Y9389 Activity, other specified: Secondary | ICD-10-CM | POA: Diagnosis not present

## 2015-03-20 DIAGNOSIS — W1839XA Other fall on same level, initial encounter: Secondary | ICD-10-CM | POA: Diagnosis not present

## 2015-03-20 DIAGNOSIS — Z8739 Personal history of other diseases of the musculoskeletal system and connective tissue: Secondary | ICD-10-CM | POA: Diagnosis not present

## 2015-03-20 LAB — URINALYSIS, ROUTINE W REFLEX MICROSCOPIC
Bilirubin Urine: NEGATIVE
Glucose, UA: NEGATIVE mg/dL
HGB URINE DIPSTICK: NEGATIVE
Ketones, ur: NEGATIVE mg/dL
Nitrite: POSITIVE — AB
PROTEIN: NEGATIVE mg/dL
Specific Gravity, Urine: 1.019 (ref 1.005–1.030)
UROBILINOGEN UA: 1 mg/dL (ref 0.0–1.0)
pH: 6.5 (ref 5.0–8.0)

## 2015-03-20 LAB — BASIC METABOLIC PANEL
Anion gap: 8 (ref 5–15)
BUN: 34 mg/dL — AB (ref 6–20)
CALCIUM: 9.1 mg/dL (ref 8.9–10.3)
CO2: 27 mmol/L (ref 22–32)
CREATININE: 1.04 mg/dL — AB (ref 0.44–1.00)
Chloride: 106 mmol/L (ref 101–111)
GFR calc Af Amer: 58 mL/min — ABNORMAL LOW (ref >60)
GFR, EST NON AFRICAN AMERICAN: 50 mL/min — AB (ref >60)
GLUCOSE: 91 mg/dL (ref 65–99)
Potassium: 4.6 mmol/L (ref 3.5–5.1)
Sodium: 141 mmol/L (ref 135–145)

## 2015-03-20 LAB — CBC WITH DIFFERENTIAL/PLATELET
BASOS ABS: 0 10*3/uL (ref 0.0–0.1)
BASOS PCT: 0 % (ref 0–1)
EOS ABS: 0.3 10*3/uL (ref 0.0–0.7)
EOS PCT: 6 % — AB (ref 0–5)
HCT: 36.5 % (ref 36.0–46.0)
Hemoglobin: 11.8 g/dL — ABNORMAL LOW (ref 12.0–15.0)
Lymphocytes Relative: 33 % (ref 12–46)
Lymphs Abs: 1.6 10*3/uL (ref 0.7–4.0)
MCH: 28.2 pg (ref 26.0–34.0)
MCHC: 32.3 g/dL (ref 30.0–36.0)
MCV: 87.1 fL (ref 78.0–100.0)
MONO ABS: 0.9 10*3/uL (ref 0.1–1.0)
MONOS PCT: 19 % — AB (ref 3–12)
Neutro Abs: 2 10*3/uL (ref 1.7–7.7)
Neutrophils Relative %: 42 % — ABNORMAL LOW (ref 43–77)
PLATELETS: 179 10*3/uL (ref 150–400)
RBC: 4.19 MIL/uL (ref 3.87–5.11)
RDW: 14.4 % (ref 11.5–15.5)
WBC: 4.7 10*3/uL (ref 4.0–10.5)

## 2015-03-20 LAB — URINE MICROSCOPIC-ADD ON

## 2015-03-20 LAB — TROPONIN I: Troponin I: <0.03 ng/mL (ref <0.031)

## 2015-03-20 MED ORDER — CEPHALEXIN 500 MG PO CAPS: 500.0000 mg | ORAL_CAPSULE | Freq: Two times a day (BID) | ORAL | Status: DC

## 2015-03-20 NOTE — Discharge Instructions (Signed)

## 2015-03-20 NOTE — ED Notes (Signed)
[facility] called for transport.  

## 2015-03-20 NOTE — ED Provider Notes (Signed)
CSN: [id number]     Arrival date & time 03/20/15  2004 History   First MD Initiated Contact with Patient 03/20/15 2010     Chief Complaint  Patient presents with  . Fall     (Consider location/radiation/quality/duration/timing/severity/associated sxs/prior Treatment) HPI Comments: Pt comes in from [facility] after a fall. She states that she was watching tv in her chair for a while and wanted to change the volume. She got up and the room started spinning and she fell back on the bed. Denies loc. She is not hurting anywhere at this time. No fever, cp, or sob. She states that she was seen recently for bilateral lower extremity edema and her doctor told her it was her thyroid. She is was started on celexa in the last 2 days  The history is provided by the patient. No language interpreter was used.    Past Medical History  Diagnosis Date  . Atrial fibrillation   . Herpes zoster   . DVT (deep venous thrombosis)     per pt history  . Arthritis   . Anemia     per pt history  . Osteoporosis   . Depression   . Neuropathy    Past Surgical History  Procedure Laterality Date  . Tonsillectomy  1944  . Rotator cuff repair  2004  . Tibia fracture surgery  2006  . Uterine suspension     Family History  Problem Relation Age of Onset  . Cancer Mother     colon  . Hypertension Mother   . Parkinsonism Father   . Hypertension Father   . Alzheimer's disease Brother   . Diabetes Neg Hx   . Heart attack Neg Hx   . Hyperlipidemia Neg Hx   . Sudden death Neg Hx    Social History  Substance Use Topics  . Smoking status: Never Smoker   . Smokeless tobacco: Never Used  . Alcohol Use: Yes     Comment: socially   OB History    No data available     Review of Systems  All other systems reviewed and are negative.     Allergies  Ibuprofen  Home Medications    BP 130/67 mmHg  Pulse 88  Temp(Src) 98.3 F (36.8 C) (Oral)  Resp 18  SpO2 97% Physical Exam  Constitutional:  She is oriented to person, place, and time. She appears well-developed and well-nourished.  HENT:  Head: Normocephalic and atraumatic.  Cardiovascular: Normal rate and regular rhythm.   Pulmonary/Chest: Effort normal and breath sounds normal.  Abdominal: Soft. Bowel sounds are normal. There is no tenderness.  Musculoskeletal: She exhibits edema.  Neurological: She is alert and oriented to person, place, and time. She exhibits normal muscle tone. Coordination normal.  Skin: Skin is warm and dry.  Nursing note and vitals reviewed.   ED Course  Procedures (including critical care time) Labs Review Labs Reviewed  BASIC METABOLIC PANEL - Abnormal; Notable for the following:    BUN 34 (*)    Creatinine, Ser 1.04 (*)    GFR calc non Af Amer 50 (*)    GFR calc Af Amer 58 (*)    All other components within normal limits  CBC WITH DIFFERENTIAL/PLATELET - Abnormal; Notable for the following:    Hemoglobin 11.8 (*)    Neutrophils Relative % 42 (*)    Monocytes Relative 19 (*)    Eosinophils Relative 6 (*)    All other components within normal limits  URINALYSIS,  ROUTINE W REFLEX MICROSCOPIC (NOT AT [facility]) - Abnormal; Notable for the following:    APPearance CLOUDY (*)    Nitrite POSITIVE (*)    Leukocytes, UA LARGE (*)    All other components within normal limits  URINE MICROSCOPIC-ADD ON - Abnormal; Notable for the following:    Squamous Epithelial / LPF FEW (*)    Bacteria, UA MANY (*)    All other components within normal limits  URINE CULTURE  TROPONIN I    Imaging Review Dg Chest 2 View  03/20/2015   CLINICAL DATA:  Dizziness, fell backwards landing on carpet, history atrial fibrillation  EXAM: CHEST  2 VIEW  COMPARISON:  10/09/2013  FINDINGS: Enlargement of cardiac silhouette.  Tortuous aorta.  Mediastinal contours and pulmonary vascularity otherwise normal.  Emphysematous changes without infiltrate, pleural effusion or pneumothorax.  Bones demineralized.  IMPRESSION: Enlargement  of cardiac silhouette.  COPD changes.  No acute abnormalities.   Electronically Signed   By: Ulyses Southward M.D.   On: 03/20/2015 20:47   Ct Head Wo Contrast  03/20/2015   CLINICAL DATA:  Acute head and neck pain after fall today.  EXAM: CT HEAD WITHOUT CONTRAST  CT CERVICAL SPINE WITHOUT CONTRAST  TECHNIQUE: Multidetector CT imaging of the head and cervical spine was performed following the standard protocol without intravenous contrast. Multiplanar CT image reconstructions of the cervical spine were also generated.  COMPARISON:  CT scan of head of March 22, 2014.  FINDINGS: CT HEAD FINDINGS  Bony calvarium appears intact. Mild diffuse cortical atrophy is noted. Right occipital encephalomalacia is noted consistent with old infarction. Old left parietal infarction is noted as well. No mass effect or midline shift is noted. Ventricular size is within normal limits. There is no evidence of mass lesion, hemorrhage or acute infarction.  CT CERVICAL SPINE FINDINGS  No fracture is noted. Severe multilevel disc degenerative disc disease is noted in the cervical spine. Grade 1 anterolisthesis of C7-T1 is noted secondary to posterior facet joint hypertrophy. Visualized lung apices are unremarkable.  IMPRESSION: Mild diffuse cortical atrophy. 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 No acute intracranial abnormality seen.  Severe multilevel degenerative disc disease is noted in the cervical spine. No fracture is noted.   Electronically Signed   By: Lupita Raider, M.D.   On: 03/20/2015 21:06   Ct Cervical Spine Wo Contrast  03/20/2015   CLINICAL DATA:  Acute head and neck pain after fall today.  EXAM: CT HEAD WITHOUT CONTRAST  CT CERVICAL SPINE WITHOUT CONTRAST  TECHNIQUE: Multidetector CT imaging of the head and cervical spine was performed following the standard protocol without intravenous contrast. Multiplanar CT image reconstructions of the cervical spine were also generated.  COMPARISON:  CT scan of head  of March 22, 2014.  FINDINGS: CT HEAD FINDINGS  Bony calvarium appears intact. Mild diffuse cortical atrophy is noted. Right occipital encephalomalacia is noted consistent with old infarction. Old left parietal infarction is noted as well. No mass effect or midline shift is noted. Ventricular size is within normal limits. There is no evidence of mass lesion, hemorrhage or acute infarction.  CT CERVICAL SPINE FINDINGS  No fracture is noted. Severe multilevel disc degenerative disc disease is noted in the cervical spine. Grade 1 anterolisthesis of C7-T1 is noted secondary to posterior facet joint hypertrophy. Visualized lung apices are unremarkable.  IMPRESSION: Mild diffuse cortical atrophy. 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 No acute intracranial abnormality seen.  Severe multilevel degenerative disc disease is  noted in the cervical spine. No fracture is noted.   Electronically Signed   By: Lupita Raider, M.D.   On: 03/20/2015 21:06   I have personally reviewed and evaluated these images and lab results as part of my medical decision-making.   EKG Interpretation   Date/Time:  Monday March 20 2015 20:18:33 EDT Ventricular Rate:  82 PR Interval:    QRS Duration: 84 QT Interval:  344 QTC Calculation: 401 R Axis:   47 Text Interpretation:  Atrial fibrillation Abnormal ECG No significant  change since last tracing Confirmed by Rubin Payor  MD, Harrold Donath [phone]) on  03/20/2015 8:19:21 PM      MDM   Final diagnoses:  UTI (lower urinary tract infection)  Fall, initial encounter  Dizziness  Bilateral edema of lower extremity    Pt able to ambulate with a walker without any problem. Not feeling dizzy at this time;pt continues to not have cp. Pt is okay to follow up with pcp.    Teressa Lower, NP 03/20/15 2229  Benjiman Core, MD 03/20/15 [id number]

## 2015-03-20 NOTE — ED Notes (Signed)
Pt c/o dizziness with fall on carpet denies pain . Broughtin by EMS form [facility]

## 2015-03-20 NOTE — ED Notes (Signed)
amb to BR with minimal assist

## 2015-03-24 LAB — URINE CULTURE: Culture: >=100000

## 2015-03-26 ENCOUNTER — Telehealth (HOSPITAL_COMMUNITY): Payer: Self-pay

## 2015-03-26 NOTE — Telephone Encounter (Signed)
Post ED Visit - Positive Culture Follow-up  Culture report reviewed by antimicrobial stewardship pharmacist:  Wes Dulaney, Pharm.D., BCPS  Celedonio Miyamoto, Pharm.D., BCPS  Georgina Pillion, Pharm.D., BCPS  [place], [place].D., BCPS, AAHIVP  Estella Husk, Pharm.D., BCPS, AAHIVP  Elder Cyphers, [address].D., BCPS X  Cassie Stewart, Pharm.D.  Positive Urine culture, >/= 100,000 colonies -> E Coli Treated with Cephalexin, organism sensitive to the same and no further patient follow-up is required at this time.  Arvid Right 03/26/2015, 5:16 AM

## 2015-04-08 ENCOUNTER — Emergency Department (HOSPITAL_COMMUNITY): Payer: Medicare HMO

## 2015-04-08 ENCOUNTER — Inpatient Hospital Stay (HOSPITAL_COMMUNITY)
Admission: EM | Admit: 2015-04-08 | Discharge: 2015-04-10 | DRG: 682 | Disposition: A | Discharge status: Home Health | Payer: Medicare HMO | Attending: Internal Medicine | Admitting: Internal Medicine

## 2015-04-08 ENCOUNTER — Encounter (HOSPITAL_COMMUNITY): Payer: Self-pay | Admitting: Cardiology

## 2015-04-08 DIAGNOSIS — F329 Major depressive disorder, single episode, unspecified: Secondary | ICD-10-CM | POA: Diagnosis present

## 2015-04-08 DIAGNOSIS — I4891 Unspecified atrial fibrillation: Secondary | ICD-10-CM | POA: Diagnosis present

## 2015-04-08 DIAGNOSIS — Z86718 Personal history of other venous thrombosis and embolism: Secondary | ICD-10-CM

## 2015-04-08 DIAGNOSIS — D649 Anemia, unspecified: Secondary | ICD-10-CM | POA: Diagnosis present

## 2015-04-08 DIAGNOSIS — M1990 Unspecified osteoarthritis, unspecified site: Secondary | ICD-10-CM | POA: Diagnosis present

## 2015-04-08 DIAGNOSIS — G629 Polyneuropathy, unspecified: Secondary | ICD-10-CM | POA: Diagnosis present

## 2015-04-08 DIAGNOSIS — Z820 Family history of epilepsy and other diseases of the nervous system: Secondary | ICD-10-CM | POA: Diagnosis not present

## 2015-04-08 DIAGNOSIS — Z8249 Family history of ischemic heart disease and other diseases of the circulatory system: Secondary | ICD-10-CM | POA: Diagnosis not present

## 2015-04-08 DIAGNOSIS — R40243 Glasgow coma scale score 3-8: Secondary | ICD-10-CM | POA: Diagnosis present

## 2015-04-08 DIAGNOSIS — R4182 Altered mental status, unspecified: Secondary | ICD-10-CM | POA: Diagnosis not present

## 2015-04-08 DIAGNOSIS — E039 Hypothyroidism, unspecified: Secondary | ICD-10-CM | POA: Diagnosis present

## 2015-04-08 DIAGNOSIS — G9340 Encephalopathy, unspecified: Secondary | ICD-10-CM | POA: Diagnosis present

## 2015-04-08 DIAGNOSIS — E860 Dehydration: Secondary | ICD-10-CM | POA: Diagnosis present

## 2015-04-08 DIAGNOSIS — M810 Age-related osteoporosis without current pathological fracture: Secondary | ICD-10-CM | POA: Diagnosis present

## 2015-04-08 DIAGNOSIS — I482 Chronic atrial fibrillation: Secondary | ICD-10-CM | POA: Diagnosis not present

## 2015-04-08 DIAGNOSIS — F32A Depression, unspecified: Secondary | ICD-10-CM | POA: Diagnosis present

## 2015-04-08 DIAGNOSIS — I82409 Acute embolism and thrombosis of unspecified deep veins of unspecified lower extremity: Secondary | ICD-10-CM | POA: Diagnosis present

## 2015-04-08 DIAGNOSIS — N179 Acute kidney failure, unspecified: Principal | ICD-10-CM | POA: Diagnosis present

## 2015-04-08 DIAGNOSIS — Z7901 Long term (current) use of anticoagulants: Secondary | ICD-10-CM | POA: Diagnosis not present

## 2015-04-08 LAB — RAPID URINE DRUG SCREEN, HOSP PERFORMED
Amphetamines: NOT DETECTED
Barbiturates: NOT DETECTED
Benzodiazepines: NOT DETECTED
Cocaine: NOT DETECTED
OPIATES: NOT DETECTED
Tetrahydrocannabinol: NOT DETECTED

## 2015-04-08 LAB — COMPREHENSIVE METABOLIC PANEL
ALBUMIN: 3 g/dL — AB (ref 3.5–5.0)
ALT: 20 U/L (ref 14–54)
AST: 38 U/L (ref 15–41)
Alkaline Phosphatase: 48 U/L (ref 38–126)
Anion gap: 9 (ref 5–15)
BUN: 26 mg/dL — AB (ref 6–20)
CHLORIDE: 107 mmol/L (ref 101–111)
CO2: 23 mmol/L (ref 22–32)
CREATININE: 1.29 mg/dL — AB (ref 0.44–1.00)
Calcium: 8.7 mg/dL — ABNORMAL LOW (ref 8.9–10.3)
GFR calc Af Amer: 45 mL/min — ABNORMAL LOW (ref >60)
GFR calc non Af Amer: 39 mL/min — ABNORMAL LOW (ref >60)
GLUCOSE: 90 mg/dL (ref 65–99)
Potassium: 4.2 mmol/L (ref 3.5–5.1)
Sodium: 139 mmol/L (ref 135–145)
Total Bilirubin: 0.9 mg/dL (ref 0.3–1.2)
Total Protein: 6.1 g/dL — ABNORMAL LOW (ref 6.5–8.1)

## 2015-04-08 LAB — URINALYSIS, ROUTINE W REFLEX MICROSCOPIC
Bilirubin Urine: NEGATIVE
Glucose, UA: NEGATIVE mg/dL
HGB URINE DIPSTICK: NEGATIVE
KETONES UR: NEGATIVE mg/dL
Leukocytes, UA: NEGATIVE
Nitrite: NEGATIVE
PH: 6.5 (ref 5.0–8.0)
PROTEIN: NEGATIVE mg/dL
Specific Gravity, Urine: 1.009 (ref 1.005–1.030)
Urobilinogen, UA: 0.2 mg/dL (ref 0.0–1.0)

## 2015-04-08 LAB — TROPONIN I
TROPONIN I: 0.07 ng/mL — AB (ref <0.031)
TROPONIN I: 0.07 ng/mL — AB (ref <0.031)

## 2015-04-08 LAB — CREATININE, URINE, RANDOM: CREATININE, URINE: 29.69 mg/dL

## 2015-04-08 LAB — CBC WITH DIFFERENTIAL/PLATELET
Basophils Absolute: 0 10*3/uL (ref 0.0–0.1)
Basophils Relative: 0 % (ref 0–1)
EOS ABS: 0.1 10*3/uL (ref 0.0–0.7)
EOS PCT: 1 % (ref 0–5)
HCT: 37.9 % (ref 36.0–46.0)
Hemoglobin: 12.3 g/dL (ref 12.0–15.0)
LYMPHS ABS: 0.9 10*3/uL (ref 0.7–4.0)
LYMPHS PCT: 16 % (ref 12–46)
MCH: 28 pg (ref 26.0–34.0)
MCHC: 32.5 g/dL (ref 30.0–36.0)
MCV: 86.3 fL (ref 78.0–100.0)
MONO ABS: 0.6 10*3/uL (ref 0.1–1.0)
Monocytes Relative: 11 % (ref 3–12)
Neutro Abs: 3.9 10*3/uL (ref 1.7–7.7)
Neutrophils Relative %: 72 % (ref 43–77)
PLATELETS: 128 10*3/uL — AB (ref 150–400)
RBC: 4.39 MIL/uL (ref 3.87–5.11)
RDW: 14.1 % (ref 11.5–15.5)
WBC: 5.4 10*3/uL (ref 4.0–10.5)

## 2015-04-08 LAB — TSH: TSH: 0.006 u[IU]/mL — AB (ref 0.350–4.500)

## 2015-04-08 LAB — SODIUM, URINE, RANDOM: Sodium, Ur: 122 mmol/L

## 2015-04-08 LAB — CK: CK TOTAL: 72 U/L (ref 38–234)

## 2015-04-08 LAB — VITAMIN B12: Vitamin B-12: 542 pg/mL (ref 180–914)

## 2015-04-08 LAB — ETHANOL: Alcohol, Ethyl (B): <5 mg/dL (ref <5)

## 2015-04-08 LAB — MAGNESIUM: Magnesium: 1.9 mg/dL (ref 1.7–2.4)

## 2015-04-08 MED ORDER — ENSURE ENLIVE PO LIQD
237.0000 mL | Freq: Two times a day (BID) | ORAL | Status: DC
  Administered 2015-04-08 – 2015-04-09 (×3): 237 mL via ORAL

## 2015-04-08 MED ORDER — SORBITOL 70 % SOLN: 30.0000 mL | Freq: Every day | Status: DC | PRN

## 2015-04-08 MED ORDER — DILTIAZEM HCL 30 MG PO TABS
30.0000 mg | ORAL_TABLET | Freq: Four times a day (QID) | ORAL | Status: DC
  Administered 2015-04-08 – 2015-04-10 (×9): 30 mg via ORAL
  Filled 2015-04-08 (×9): qty 1

## 2015-04-08 MED ORDER — HYDROXYZINE HCL 10 MG PO TABS: 10.0000 mg | ORAL_TABLET | Freq: Three times a day (TID) | ORAL | Status: DC | PRN

## 2015-04-08 MED ORDER — SODIUM CHLORIDE 0.9 % IV SOLN: INTRAVENOUS | Status: AC

## 2015-04-08 MED ORDER — SODIUM CHLORIDE 0.9 % IV BOLUS (SEPSIS)
500.0000 mL | Freq: Once | INTRAVENOUS | Status: AC
  Administered 2015-04-08: 500 mL via INTRAVENOUS

## 2015-04-08 MED ORDER — ACETAMINOPHEN 500 MG PO TABS: 500.0000 mg | ORAL_TABLET | Freq: Four times a day (QID) | ORAL | Status: DC | PRN

## 2015-04-08 MED ORDER — SERTRALINE HCL 100 MG PO TABS
100.0000 mg | ORAL_TABLET | Freq: Every day | ORAL | Status: DC
  Administered 2015-04-09 – 2015-04-10 (×2): 100 mg via ORAL
  Filled 2015-04-08 (×2): qty 1

## 2015-04-08 MED ORDER — SODIUM CHLORIDE 0.9 % IJ SOLN
3.0000 mL | Freq: Two times a day (BID) | INTRAMUSCULAR | Status: DC
Start: 2015-04-08 — End: 2015-04-10

## 2015-04-08 MED ORDER — ALBUTEROL SULFATE (2.5 MG/3ML) 0.083% IN NEBU: 2.5000 mg | INHALATION_SOLUTION | RESPIRATORY_TRACT | Status: DC | PRN

## 2015-04-08 MED ORDER — HYDROCORTISONE 2.5 % EX CREA: TOPICAL_CREAM | Freq: Two times a day (BID) | CUTANEOUS | Status: DC

## 2015-04-08 MED ORDER — APIXABAN 5 MG PO TABS
5.0000 mg | ORAL_TABLET | Freq: Two times a day (BID) | ORAL | Status: DC
  Administered 2015-04-08 – 2015-04-10 (×4): 5 mg via ORAL
  Filled 2015-04-08 (×4): qty 1

## 2015-04-08 MED ORDER — CITALOPRAM HYDROBROMIDE 20 MG PO TABS
10.0000 mg | ORAL_TABLET | Freq: Every day | ORAL | Status: DC
  Administered 2015-04-09 – 2015-04-10 (×2): 10 mg via ORAL
  Filled 2015-04-08 (×2): qty 1

## 2015-04-08 MED ORDER — ONDANSETRON HCL 4 MG PO TABS: 4.0000 mg | ORAL_TABLET | Freq: Four times a day (QID) | ORAL | Status: DC | PRN

## 2015-04-08 MED ORDER — ONDANSETRON HCL 4 MG/2ML IJ SOLN: 4.0000 mg | Freq: Four times a day (QID) | INTRAMUSCULAR | Status: DC | PRN

## 2015-04-08 MED ORDER — ALUM & MAG HYDROXIDE-SIMETH 200-200-20 MG/5ML PO SUSP
30.0000 mL | Freq: Four times a day (QID) | ORAL | Status: DC | PRN
Start: 2015-04-08 — End: 2015-04-10

## 2015-04-08 MED ORDER — OXYCODONE HCL 5 MG PO TABS: 5.0000 mg | ORAL_TABLET | ORAL | Status: DC | PRN

## 2015-04-08 MED ORDER — HYDROCORTISONE 1 % EX CREA
TOPICAL_CREAM | Freq: Two times a day (BID) | CUTANEOUS | Status: DC
  Administered 2015-04-08 – 2015-04-09 (×2): via TOPICAL
  Filled 2015-04-08: qty 28

## 2015-04-08 MED ORDER — SODIUM CHLORIDE 0.9 % IV SOLN
INTRAVENOUS | Status: AC
  Administered 2015-04-08 – 2015-04-09 (×4): via INTRAVENOUS

## 2015-04-08 MED ORDER — SENNOSIDES-DOCUSATE SODIUM 8.6-50 MG PO TABS: 1.0000 | ORAL_TABLET | Freq: Every evening | ORAL | Status: DC | PRN

## 2015-04-08 NOTE — ED Notes (Signed)
Admitting Provider informed of Trop

## 2015-04-08 NOTE — H&P (Signed)
[facility] History and Physical  Felicia Lara WJX:[id number] DOB: [birth year] DOA: 04/08/2015  Referring physician: Dr. Rennis Chris PCP: Pcp Not In System   Chief Complaint: Unresponsive/altered mental status  HPI: Felicia Lara is a 81 y.o. female  With history of atrial fibrillation on chronic anticoagulation, prior history of DVT, depression, anemia who lives in an independent living facility who presented to the ED with altered mental status. Patient is currently alert however unable to tell what happened. Per ED physician who spoke Felicia Lara, manager of independent living facility it was stated that the staff let themselves and to patient's apartment on the morning of admission and at that time noted that water was running from the bathtub in the apartment was extremely hot and humid with steam dripping from the walls. Patient was found laying on a bed naked and unresponsive. Oxygen was applied and patient's initial Glasgow Coma Score was 8. Patient was subsequently brought to the ED. Patient improved symptomatically in the emergency room that at the time of my evaluation a Glasgow coma score was 15. Patient is unable to recall what happened. Patient at the time of the interview is alert and oriented 3. Patient denies any fevers, no chills, no nausea, no vomiting, no abdominal pain, no chest pain, no shortness of breath, no dysuria, no diarrhea, no constipation, no emesis, no melanotic, no hematemesis, no hematochezia, no weakness. Patient states she usually gets around with the aid of a wheelchair. Patient was seen in the emergency room, compressive metabolic profile done had a BUN of 26 creatinine of 1.29 without luminal 3.0+ was within normal limits. CBC done had a platelet count of 128 otherwise was unremarkable. Alcohol level was less than 5. Urinalysis is pending. CT head was negative for any acute abnormalities. EKG with A. fib with heart rate of 125. Hospice were called to admit  the patient for further evaluation and management.   Review of Systems: As per history of present illness otherwise negative. Constitutional:  No weight loss, night sweats, Fevers, chills, fatigue.  HEENT:  No headaches, Difficulty swallowing,Tooth/dental problems,Sore throat,  No sneezing, itching, ear ache, nasal congestion, post nasal drip,  Cardio-vascular:  No chest pain, Orthopnea, PND, swelling in lower extremities, anasarca, dizziness, palpitations  GI:  No heartburn, indigestion, abdominal pain, nausea, vomiting, diarrhea, change in bowel habits, loss of appetite  Resp:  No shortness of breath with exertion or at rest. No excess mucus, no productive cough, No non-productive cough, No coughing up of blood.No change in color of mucus.No wheezing.No chest wall deformity  Skin:  no rash or lesions.  GU:  no dysuria, change in color of urine, no urgency or frequency. No flank pain.  Musculoskeletal:  No joint pain or swelling. No decreased range of motion. No back pain.  Psych:  No change in mood or affect. No depression or anxiety. No memory loss.   Past Medical History  Diagnosis Date  . Atrial fibrillation   . Herpes zoster   . DVT (deep venous thrombosis)     per pt history  . Arthritis   . Anemia     per pt history  . Osteoporosis   . Depression   . Neuropathy    Past Surgical History  Procedure Laterality Date  . Tonsillectomy  1944  . Rotator cuff repair  2004  . Tibia fracture surgery  2006  . Uterine suspension     Social History:  reports that she has never smoked. She has never used smokeless  tobacco. She reports that she drinks alcohol. She reports that she does not use illicit drugs.  Allergies  Allergen Reactions  . Ibuprofen Rash    Family History  Problem Relation Age of Onset  . Cancer Mother     colon  . Hypertension Mother   . Parkinsonism Father   . Hypertension Father   . Alzheimer's disease Brother   . Diabetes Neg Hx   . Heart  attack Neg Hx   . Hyperlipidemia Neg Hx   . Sudden death Neg Hx      Prior to Admission medications   Medication Sig Start Date End Date Taking? Authorizing Provider  acetaminophen (TYLENOL) 500 MG tablet Take 500 mg by mouth every 6 (six) hours as needed.    Historical Provider, MD  apixaban (ELIQUIS) 5 MG TABS tablet Take 5 mg by mouth 2 (two) times daily.    Historical Provider, MD  cephALEXin (KEFLEX) 500 MG capsule Take 1 capsule (500 mg total) by mouth 2 (two) times daily. 03/20/15   Teressa Lower, NP  citalopram (CELEXA) 10 MG tablet Take 10 mg by mouth daily.    Historical Provider, MD  diltiazem (CARDIZEM) 30 MG tablet Take 30 mg by mouth 4 (four) times daily.    Historical Provider, MD  doxycycline (VIBRAMYCIN) 100 MG capsule Take 1 capsule (100 mg total) by mouth 2 (two) times daily. One po bid x 7 days 02/19/15   Rolan Bucco, MD  GABAPENTIN, ONCE-DAILY, PO Take by mouth.    Historical Provider, MD  hydrocortisone 2.5 % cream Apply topically 2 (two) times daily. 08/31/12   Kendrick Ranch, MD  hydrOXYzine (ATARAX/VISTARIL) 10 MG tablet Take 1 tablet (10 mg total) by mouth 3 (three) times daily as needed for itching. 08/31/12   Kendrick Ranch, MD  sertraline (ZOLOFT) 100 MG tablet Take 1 tablet (100 mg total) by mouth daily. 03/08/13   Kendrick Ranch, MD  UNABLE TO FIND Med Name:     Historical Provider, MD  UNKNOWN TO PATIENT     Historical Provider, MD   Physical Exam: Filed Vitals:   04/08/15 0900 04/08/15 1015 04/08/15 1030 04/08/15 1100  BP: 112/65 109/58 116/64 113/68  Pulse: 98 96 101 87  Temp:      TempSrc:      Resp: 23 24 27 25   Weight:      SpO2: 94% 97% 95% 96%    Wt Readings from Last 3 Encounters:  04/08/15 79.379 kg (175 lb)  02/19/15 79.379 kg (175 lb)  09/11/14 72.576 kg (160 lb)    General:  Elderly female well-developed well-nourished laying on gurney no acute cardiopulmonary distress. Easily arousable. Eyes: PERRLA, EOMI, normal  lids, irises & conjunctiva ENT: grossly normal hearing, lips & tongue. Extremely dry mucous membranes. Neck: no LAD, masses or thyromegaly Cardiovascular: Irregularly irregular. No LE edema. Telemetry: Atrial fibrillation.  Respiratory: CTA bilaterally, no w/r/r. Normal respiratory effort. Abdomen: soft, ntnd, positive bowel sounds, no rebound, no guarding Skin: no rash or induration seen on limited exam Musculoskeletal: grossly normal tone BUE/BLE Psychiatric: grossly normal mood and affect, speech fluent and appropriate Neurologic: Alert and on to 3. Cranial nerves II through XII are grossly intact. Visual fields are intact. Sensation is intact. Gait not tested secondary to safety. No focal deficits.           Labs on Admission:  Basic Metabolic Panel:  Recent Labs Lab 04/08/15 0857  NA 139  K 4.2  CL 107  CO2 23  GLUCOSE 90  BUN 26*  CREATININE 1.29*  CALCIUM 8.7*   Liver Function Tests:  Recent Labs Lab 04/08/15 0857  AST 38  ALT 20  ALKPHOS 48  BILITOT 0.9  PROT 6.1*  ALBUMIN 3.0*   No results for input(s): LIPASE, AMYLASE in the last 168 hours. No results for input(s): AMMONIA in the last 168 hours. CBC:  Recent Labs Lab 04/08/15 0857  WBC 5.4  NEUTROABS 3.9  HGB 12.3  HCT 37.9  MCV 86.3  PLT 128*   Cardiac Enzymes:  Recent Labs Lab 04/08/15 0857  CKTOTAL 72    BNP (last 3 results)  Recent Labs  02/19/15 1305  BNP 187.2*    ProBNP (last 3 results) No results for input(s): PROBNP in the last 8760 hours.  CBG: No results for input(s): GLUCAP in the last 168 hours.  Radiological Exams on Admission: Ct Head Wo Contrast  04/08/2015   CLINICAL DATA:  Altered mental status  EXAM: CT HEAD WITHOUT CONTRAST  TECHNIQUE: Contiguous axial images were obtained from the base of the skull through the vertex without intravenous contrast.  COMPARISON:  03/20/2015  FINDINGS: Encephalomalacia in the right occipital and temporal lobes are stable.  Low-density in the peripheral left parietal region is likely signal averaging with CSF. No mass effect, midline shift, or acute hemorrhage. Chronic ischemic changes in the periventricular white matter and global atrophy are stable. Chronic ischemic change at the left parietal gray-white junction is stable. Intact cranium. Mastoid air cells and visualized paranasal sinuses are clear.  IMPRESSION: No acute intracranial pathology.  Chronic changes are noted.   Electronically Signed   By: Jolaine Click M.D.   On: 04/08/2015 09:31    EKG: Independently reviewed. Atrial fibrillation heart rate 125  Assessment/Plan Principal Problem:   Encephalopathy acute Active Problems:   Atrial fibrillation   Arthritis   Anemia   DVT (deep venous thrombosis)   Osteoporosis   Depression   ARF (acute renal failure)   Dehydration  #1 acute encephalopathy Questionable etiology. May be secondary to a heat stroke as patient was noted to have a bathtub water running and when she was found the apartment with extremely hot with steam dripping down the walls. Patient currently alert and on to 3. Patient with no complaints. Patient with no focal neurological symptoms. CT head is negative. Chest x-ray is pending. Urinalysis is pending. Will cycle cardiac enzymes every 6 hours 3. Check a TSH. Check a B-12, RPR, RBC folate. Check HIV. Hydrated IV fluids and follow.  #2 atrial fibrillation Currently rate controlled. Continue home regimen of Cardizem. Eliquis for anticoagulation.  #3 dehydration IV fluids.  #4 history of DVT On anticoagulation.  #5 acute renal failure Likely secondary to prerenal azotemia in the setting of dehydration. Will check a fractional excretion of sodium. Hydrated with IV fluids. Follow. If no improvement in renal function may benefit from a renal ultrasound.  #6 depression Stable. Continue home regimen of Celexa and Zoloft.  #7 prophylaxis Heparin for DVT prophylaxis.   Code Status:  Full DVT Prophylaxis: Patient on eliquis Family Communication: Updated patient. No family at bedside. Disposition Plan: Admit to telemetry.  Time spent: 65 minutes  Felicia Lara M.D. [facility] Pager [phone]

## 2015-04-08 NOTE — ED Notes (Signed)
Admitting Provider at the bedside.  

## 2015-04-08 NOTE — Progress Notes (Signed)
Patient arrived on unit via stretcher from ED, no family at bedside.  Telemetry placed per MD order and CMT notified.

## 2015-04-08 NOTE — ED Notes (Signed)
Pt being transported to 6E via stretcher w/telemetry.

## 2015-04-08 NOTE — ED Notes (Signed)
GRANDSONS PHONE [phone]

## 2015-04-08 NOTE — Progress Notes (Signed)
4:11PM CSW went and introduced self to patient. Patient alert and oriented. Patient informed CSW that she is from [facility] independent living facility. Patient voices no concerns regarding returning back to [place], but advised CSW to speak with her grandson regarding patient returning to facility.   CSW made contact with grandson Apolinar Junes via telephone at [phone]. CSW informed grandson of patient's admission, and followed up regarding patient returning to [facility] once medically stable. Grandson voiced concerns regarding the lack of assistance provided to the patient from facility. Lucila Maine states that he would like to explore the possibility of getting the patient placed elsewhere. CSW acknowledged his concerns and informed him of qualifying stay for possible placement. Grandson voiced understanding, and informed CSW that he will be transporting the patient once medically stable and ready for discharge.   CSW to follow up with [facility].   [name], LCSWA Clinical Social Worker [facility] Ph: [phone]

## 2015-04-08 NOTE — Progress Notes (Signed)
UR completed. Pt is 81 y.o F brought to the ED emergently from [facility] facility where she was found naked and unresponsive lying on her bed with the hot water running in her bath. Grandson reports this is pt's second ED visit this week as she was seen at [facility]  earlier this week for unknown reason. He is concerned that she may need higher level of care and both he and his mother will be in communication while she is in the hospital to assist with planning based on recommendations of medical, social work and case Biomedical engineer. CM will follow for disposition and discharge needs. CSW folowing.

## 2015-04-08 NOTE — ED Provider Notes (Signed)
CSN: [id number]     Arrival date & time 04/08/15  0807 History   First MD Initiated Contact with Patient 04/08/15 [phone]     Chief Complaint  Patient presents with  . Altered Mental Status     (Consider location/radiation/quality/duration/timing/severity/associated sxs/prior Treatment) HPI L5 cavity of altered mental status . History is obtained from EMS. Attempted to call independent living facility [facility]), no answer. Patient was found by EMS unresponsive in bed this morning. Her apartment was extremely hot. Water was running in the bathtub at the time. Patient has no recall of event. She was treated with supplemental oxygen. Initial Glasgow Coma Score of 8. Patient is presently asymptomatic. Past Medical History  Diagnosis Date  . Atrial fibrillation   . Herpes zoster   . DVT (deep venous thrombosis)     per pt history  . Arthritis   . Anemia     per pt history  . Osteoporosis   . Depression   . Neuropathy    Past Surgical History  Procedure Laterality Date  . Tonsillectomy  1944  . Rotator cuff repair  2004  . Tibia fracture surgery  2006  . Uterine suspension     Family History  Problem Relation Age of Onset  . Cancer Mother     colon  . Hypertension Mother   . Parkinsonism Father   . Hypertension Father   . Alzheimer's disease Brother   . Diabetes Neg Hx   . Heart attack Neg Hx   . Hyperlipidemia Neg Hx   . Sudden death Neg Hx    Social History  Substance Use Topics  . Smoking status: Never Smoker   . Smokeless tobacco: Never Used  . Alcohol Use: Yes     Comment: socially   OB History    No data available     Review of Systems  Unable to perform ROS: Mental status change  Musculoskeletal: Positive for gait problem.       Walks with walker      Allergies  Ibuprofen  Home Medications    BP 141/66 mmHg  Pulse 101  Temp(Src) 99.5 F (37.5 C) (Rectal)  Resp 21  Wt 175 lb (79.379 kg)  SpO2 94% Physical Exam  Constitutional:  Chronically  ill-appearing  HENT:  Head: Normocephalic and atraumatic.  Eyes: Conjunctivae are normal. Pupils are equal, round, and reactive to light.  Neck: Neck supple. No tracheal deviation present. No thyromegaly present.  Cardiovascular:  No murmur heard. Mildly tachycardic irregularly irregular  Pulmonary/Chest: Effort normal and breath sounds normal.  Abdominal: Soft. Bowel sounds are normal. She exhibits no distension. There is no tenderness.  Musculoskeletal: Normal range of motion. She exhibits no edema or tenderness.  Neurological: She is alert. No cranial nerve deficit. Coordination normal.  Oriented to name, place and year does not know month, moves all extremities follow simple commands, tremulous  Skin: Skin is warm and dry. No rash noted.  Psychiatric: She has a normal mood and affect. Her behavior is normal.  Nursing note and vitals reviewed.   ED Course  Procedures (including critical care time) Labs Review Labs Reviewed  COMPREHENSIVE METABOLIC PANEL  CBC WITH DIFFERENTIAL/PLATELET  URINALYSIS, ROUTINE W REFLEX MICROSCOPIC (NOT AT [facility])    Imaging Review No results found. I have personally reviewed and evaluated these images and lab results as part of my medical decision-making.   EKG Interpretation   Date/Time:  Saturday April 08 2015 08:11:21 EDT Ventricular Rate:  125 PR Interval:  QRS Duration: 88 QT Interval:  309 QTC Calculation: 446 R Axis:   71 Text Interpretation:  Atrial fibrillation Nonspecific repol abnormality,  diffuse leads Baseline wander in lead(s) II III aVF SINCE LAST TRACING  HEART RATE HAS INCREASED Confirmed by Ethelda Chick  MD, Mellina Benison [phone]) on  04/08/2015 8:22:48 AM     Further history obtained at 10 AM by Ms. [name], manager of independent living facility: Staff let himself into her apartment this morning meal prior to calling 911 water was running from the bathtub the environment was extremely hot and humid with steamed dripping  from the walls. Patient was lying in bed naked and unresponsive  10:05 AM patient is sleeping arousable verbal stimulus Results for orders placed or performed during the hospital encounter of 04/08/15  Comprehensive metabolic panel  Result Value Ref Range   Sodium 139 135 - 145 mmol/L   Potassium 4.2 3.5 - 5.1 mmol/L   Chloride 107 101 - 111 mmol/L   CO2 23 22 - 32 mmol/L   Glucose, Bld 90 65 - 99 mg/dL   BUN 26 (H) 6 - 20 mg/dL   Creatinine, Ser 6.04 (H) 0.44 - 1.00 mg/dL   Calcium 8.7 (L) 8.9 - 10.3 mg/dL   Total Protein 6.1 (L) 6.5 - 8.1 g/dL   Albumin 3.0 (L) 3.5 - 5.0 g/dL   AST 38 15 - 41 U/L   ALT 20 14 - 54 U/L   Alkaline Phosphatase 48 38 - 126 U/L   Total Bilirubin 0.9 0.3 - 1.2 mg/dL   GFR calc non Af Amer 39 (L) >60 mL/min   GFR calc Af Amer 45 (L) >60 mL/min   Anion gap 9 5 - 15  CBC with Differential/Platelet  Result Value Ref Range   WBC 5.4 4.0 - 10.5 K/uL   RBC 4.39 3.87 - 5.11 MIL/uL   Hemoglobin 12.3 12.0 - 15.0 g/dL   HCT 54.0 98.1 - 19.1 %   MCV 86.3 78.0 - 100.0 fL   MCH 28.0 26.0 - 34.0 pg   MCHC 32.5 30.0 - 36.0 g/dL   RDW 47.8 29.5 - 62.1 %   Platelets 128 (L) 150 - 400 K/uL   Neutrophils Relative % 72 43 - 77 %   Neutro Abs 3.9 1.7 - 7.7 K/uL   Lymphocytes Relative 16 12 - 46 %   Lymphs Abs 0.9 0.7 - 4.0 K/uL   Monocytes Relative 11 3 - 12 %   Monocytes Absolute 0.6 0.1 - 1.0 K/uL   Eosinophils Relative 1 0 - 5 %   Eosinophils Absolute 0.1 0.0 - 0.7 K/uL   Basophils Relative 0 0 - 1 %   Basophils Absolute 0.0 0.0 - 0.1 K/uL  Ethanol  Result Value Ref Range   Alcohol, Ethyl (B) <5 <5 mg/dL   Dg Chest 2 View  10/03/6576   CLINICAL DATA:  Dizziness, fell backwards landing on carpet, history atrial fibrillation  EXAM: CHEST  2 VIEW  COMPARISON:  10/09/2013  FINDINGS: Enlargement of cardiac silhouette.  Tortuous aorta.  Mediastinal contours and pulmonary vascularity otherwise normal.  Emphysematous changes without infiltrate, pleural effusion or  pneumothorax.  Bones demineralized.  IMPRESSION: Enlargement of cardiac silhouette.  COPD changes.  No acute abnormalities.   Electronically Signed   By: Ulyses Southward M.D.   On: 03/20/2015 20:47   Ct Head Wo Contrast  04/08/2015   CLINICAL DATA:  Altered mental status  EXAM: CT HEAD WITHOUT CONTRAST  TECHNIQUE: Contiguous axial images  were obtained from the base of the skull through the vertex without intravenous contrast.  COMPARISON:  03/20/2015  FINDINGS: Encephalomalacia in the right occipital and temporal lobes are stable. Low-density in the peripheral left parietal region is likely signal averaging with CSF. No mass effect, midline shift, or acute hemorrhage. Chronic ischemic changes in the periventricular white matter and global atrophy are stable. Chronic ischemic change at the left parietal gray-white junction is stable. Intact cranium. Mastoid air cells and visualized paranasal sinuses are clear.  IMPRESSION: No acute intracranial pathology.  Chronic changes are noted.   Electronically Signed   By: Jolaine Click M.D.   On: 04/08/2015 09:31   Ct Head Wo Contrast  03/20/2015   CLINICAL DATA:  Acute head and neck pain after fall today.  EXAM: CT HEAD WITHOUT CONTRAST  CT CERVICAL SPINE WITHOUT CONTRAST  TECHNIQUE: Multidetector CT imaging of the head and cervical spine was performed following the standard protocol without intravenous contrast. Multiplanar CT image reconstructions of the cervical spine were also generated.  COMPARISON:  CT scan of head of March 22, 2014.  FINDINGS: CT HEAD FINDINGS  Bony calvarium appears intact. Mild diffuse cortical atrophy is noted. Right occipital encephalomalacia is noted consistent with old infarction. Old left parietal infarction is noted as well. No mass effect or midline shift is noted. Ventricular size is within normal limits. There is no evidence of mass lesion, hemorrhage or acute infarction.  CT CERVICAL SPINE FINDINGS  No fracture is noted. Severe multilevel  disc degenerative disc disease is noted in the cervical spine. Grade 1 anterolisthesis of C7-T1 is noted secondary to posterior facet joint hypertrophy. Visualized lung apices are unremarkable.  IMPRESSION: Mild diffuse cortical atrophy. 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 No acute intracranial abnormality seen.  Severe multilevel degenerative disc disease is noted in the cervical spine. No fracture is noted.   Electronically Signed   By: Lupita Raider, M.D.   On: 03/20/2015 21:06   Ct Cervical Spine Wo Contrast  03/20/2015   CLINICAL DATA:  Acute head and neck pain after fall today.  EXAM: CT HEAD WITHOUT CONTRAST  CT CERVICAL SPINE WITHOUT CONTRAST  TECHNIQUE: Multidetector CT imaging of the head and cervical spine was performed following the standard protocol without intravenous contrast. Multiplanar CT image reconstructions of the cervical spine were also generated.  COMPARISON:  CT scan of head of March 22, 2014.  FINDINGS: CT HEAD FINDINGS  Bony calvarium appears intact. Mild diffuse cortical atrophy is noted. Right occipital encephalomalacia is noted consistent with old infarction. Old left parietal infarction is noted as well. No mass effect or midline shift is noted. Ventricular size is within normal limits. There is no evidence of mass lesion, hemorrhage or acute infarction.  CT CERVICAL SPINE FINDINGS  No fracture is noted. Severe multilevel disc degenerative disc disease is noted in the cervical spine. Grade 1 anterolisthesis of C7-T1 is noted secondary to posterior facet joint hypertrophy. Visualized lung apices are unremarkable.  IMPRESSION: Mild diffuse cortical atrophy. 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 No acute intracranial abnormality seen.  Severe multilevel degenerative disc disease is noted in the cervical spine. No fracture is noted.   Electronically Signed   By: Lupita Raider, M.D.   On: 03/20/2015 21:06    MDM  Spoke with Dr. Janee Morn plan 23 hour  observation telemetry. Suspect heatstroke  Further lab work and work to be checked by Dr,.Janee Morn Final diagnoses:  None  Dx #1Altered mental status #  2 renal insufficency     Doug Sou, MD 04/08/15 1040

## 2015-04-08 NOTE — ED Notes (Signed)
Pt to department from independent living- staff reports pt was found in her bed this morning hot to touch and the water running. EMS packed pt with ice PTA and temp decreased. Pt initially had a GCS of 8 but improved to 15 on arrival. Bp-135/62 HR-105 16G left hand.

## 2015-04-09 DIAGNOSIS — G9340 Encephalopathy, unspecified: Secondary | ICD-10-CM

## 2015-04-09 DIAGNOSIS — I82409 Acute embolism and thrombosis of unspecified deep veins of unspecified lower extremity: Secondary | ICD-10-CM

## 2015-04-09 DIAGNOSIS — N179 Acute kidney failure, unspecified: Principal | ICD-10-CM

## 2015-04-09 DIAGNOSIS — F329 Major depressive disorder, single episode, unspecified: Secondary | ICD-10-CM

## 2015-04-09 DIAGNOSIS — I482 Chronic atrial fibrillation: Secondary | ICD-10-CM

## 2015-04-09 DIAGNOSIS — E860 Dehydration: Secondary | ICD-10-CM

## 2015-04-09 LAB — TROPONIN I: Troponin I: 0.07 ng/mL — ABNORMAL HIGH (ref <0.031)

## 2015-04-09 LAB — URINALYSIS, ROUTINE W REFLEX MICROSCOPIC
Bilirubin Urine: NEGATIVE
Glucose, UA: NEGATIVE mg/dL
HGB URINE DIPSTICK: NEGATIVE
Ketones, ur: NEGATIVE mg/dL
LEUKOCYTES UA: NEGATIVE
NITRITE: NEGATIVE
PROTEIN: NEGATIVE mg/dL
SPECIFIC GRAVITY, URINE: 1.011 (ref 1.005–1.030)
UROBILINOGEN UA: 0.2 mg/dL (ref 0.0–1.0)
pH: 6 (ref 5.0–8.0)

## 2015-04-09 LAB — CBC
HEMATOCRIT: 33.8 % — AB (ref 36.0–46.0)
HEMOGLOBIN: 11.2 g/dL — AB (ref 12.0–15.0)
MCH: 28.6 pg (ref 26.0–34.0)
MCHC: 33.1 g/dL (ref 30.0–36.0)
MCV: 86.2 fL (ref 78.0–100.0)
Platelets: 120 10*3/uL — ABNORMAL LOW (ref 150–400)
RBC: 3.92 MIL/uL (ref 3.87–5.11)
RDW: 14.3 % (ref 11.5–15.5)
WBC: 3.4 10*3/uL — AB (ref 4.0–10.5)

## 2015-04-09 LAB — COMPREHENSIVE METABOLIC PANEL
ALBUMIN: 2.7 g/dL — AB (ref 3.5–5.0)
ALT: 16 U/L (ref 14–54)
ANION GAP: 6 (ref 5–15)
AST: 28 U/L (ref 15–41)
Alkaline Phosphatase: 44 U/L (ref 38–126)
BILIRUBIN TOTAL: 1 mg/dL (ref 0.3–1.2)
BUN: 20 mg/dL (ref 6–20)
CHLORIDE: 109 mmol/L (ref 101–111)
CO2: 24 mmol/L (ref 22–32)
Calcium: 9 mg/dL (ref 8.9–10.3)
Creatinine, Ser: 0.99 mg/dL (ref 0.44–1.00)
GFR calc Af Amer: >60 mL/min (ref >60)
GFR, EST NON AFRICAN AMERICAN: 53 mL/min — AB (ref >60)
Glucose, Bld: 82 mg/dL (ref 65–99)
POTASSIUM: 3.7 mmol/L (ref 3.5–5.1)
Sodium: 139 mmol/L (ref 135–145)
TOTAL PROTEIN: 5.8 g/dL — AB (ref 6.5–8.1)

## 2015-04-09 LAB — C DIFFICILE QUICK SCREEN W PCR REFLEX
C DIFFICILE (CDIFF) INTERP: NEGATIVE
C DIFFICILE (CDIFF) TOXIN: NEGATIVE
C DIFFICLE (CDIFF) ANTIGEN: NEGATIVE

## 2015-04-09 LAB — RPR: RPR: NONREACTIVE

## 2015-04-09 LAB — MRSA PCR SCREENING: MRSA by PCR: NEGATIVE

## 2015-04-09 LAB — HIV ANTIBODY (ROUTINE TESTING W REFLEX): HIV SCREEN 4TH GENERATION: NONREACTIVE

## 2015-04-09 MED ORDER — OXYBUTYNIN CHLORIDE 5 MG PO TABS
5.0000 mg | ORAL_TABLET | Freq: Every day | ORAL | Status: DC
  Administered 2015-04-09 – 2015-04-10 (×2): 5 mg via ORAL
  Filled 2015-04-09 (×2): qty 1

## 2015-04-09 MED ORDER — HALOPERIDOL LACTATE 5 MG/ML IJ SOLN: 2.0000 mg | Freq: Four times a day (QID) | INTRAMUSCULAR | Status: DC | PRN

## 2015-04-09 MED ORDER — LEVOTHYROXINE SODIUM 25 MCG PO TABS
25.0000 ug | ORAL_TABLET | Freq: Every day | ORAL | Status: DC
  Administered 2015-04-09 – 2015-04-10 (×2): 25 ug via ORAL
  Filled 2015-04-09 (×2): qty 1

## 2015-04-09 MED ORDER — DIVALPROEX SODIUM 500 MG PO DR TAB
500.0000 mg | DELAYED_RELEASE_TABLET | Freq: Two times a day (BID) | ORAL | Status: DC
  Administered 2015-04-09 – 2015-04-10 (×2): 500 mg via ORAL
  Filled 2015-04-09 (×4): qty 1

## 2015-04-09 MED ORDER — FAMOTIDINE 20 MG PO TABS
20.0000 mg | ORAL_TABLET | Freq: Two times a day (BID) | ORAL | Status: DC
  Administered 2015-04-09 – 2015-04-10 (×2): 20 mg via ORAL
  Filled 2015-04-09 (×2): qty 1

## 2015-04-09 MED ORDER — ATORVASTATIN CALCIUM 40 MG PO TABS
40.0000 mg | ORAL_TABLET | Freq: Every day | ORAL | Status: DC
  Administered 2015-04-09 – 2015-04-10 (×2): 40 mg via ORAL
  Filled 2015-04-09 (×2): qty 1

## 2015-04-09 MED ORDER — QUETIAPINE FUMARATE 25 MG PO TABS
25.0000 mg | ORAL_TABLET | Freq: Every day | ORAL | Status: DC
  Administered 2015-04-09: 25 mg via ORAL
  Filled 2015-04-09: qty 1

## 2015-04-09 MED ORDER — FERROUS SULFATE 325 (65 FE) MG PO TABS
325.0000 mg | ORAL_TABLET | Freq: Two times a day (BID) | ORAL | Status: DC
  Administered 2015-04-09 – 2015-04-10 (×3): 325 mg via ORAL
  Filled 2015-04-09 (×3): qty 1

## 2015-04-09 MED ORDER — GABAPENTIN 100 MG PO CAPS
200.0000 mg | ORAL_CAPSULE | Freq: Every day | ORAL | Status: DC
  Administered 2015-04-09: 200 mg via ORAL
  Filled 2015-04-09: qty 2

## 2015-04-09 NOTE — Care Management Note (Signed)
Case Management Note  Patient Details  Name: Felicia Lara MRN: [id number] Date of Birth: 06-Jul-1934  Subjective/Objective:      Patient presented to ED from [facility] in [place] [phone] with AMS changes. Patient positive for a UTI,            Action/Plan: Home Health Services   Expected Discharge Date:                  Expected Discharge Plan:   Home health services  In-House Referral:  Clinical Social Work  Discharge planning Services  CM Consult  Post Acute Care Choice:    Choice offered to:     DME Arranged:    DME Agency:     HH Arranged:    [place] Agency:     Status of Service:  In process, will continue to follow  Medicare Important Message Given:    Date Medicare IM Given:    Medicare IM give by:    Date Additional Medicare IM Given:    Additional Medicare Important Message give by:     If discussed at [name], dates discussed:    Additional Comments: CM met with patient at bedside, she states that [name] (grandson) is her emergency contact and requested that I contact him. Attempted to contact him at number listed in record LVM. CM left message with staff and in room to have him call CM once he is here at the hospital to address his concerns regarding possible higher level of care and the recommendations for [facility] services.    [place]  Laurena Slimmer, RN 04/09/2015, 10:35 AM

## 2015-04-09 NOTE — Progress Notes (Signed)
[facility]                                                                              Patient Demographics  Felicia Lara, is a 81 y.o. female, DOB - Nov 21, 1933, ZOX:[id number]  Admit date - 04/08/2015   Admitting Physician Rodolph Bong, MD  Outpatient Primary MD for the patient is Pcp Not In System  LOS - 1   Chief Complaint  Patient presents with  . Altered Mental Status      HPI on 04/08/2015 by Dr. Ramiro Harvest Felicia Lara is a 81 y.o. female with history of atrial fibrillation on chronic anticoagulation, prior history of DVT, depression, anemia who lives in an independent living facility who presented to the ED with altered mental status. Patient is currently alert however unable to tell what happened. Per ED physician who spoke [name], manager of independent living facility it was stated that the staff let themselves and to patient's apartment on the morning of admission and at that time noted that water was running from the bathtub in the apartment was extremely hot and humid with steam dripping from the walls. Patient was found laying on a bed naked and unresponsive. Oxygen was applied and patient's initial Glasgow Coma Score was 8. Patient was subsequently brought to the ED. Patient improved symptomatically in the emergency room that at the time of my evaluation a Glasgow coma score was 15. Patient is unable to recall what happened. Patient at the time of the interview is alert and oriented 3. Patient denies any fevers, no chills, no nausea, no vomiting, no abdominal pain, no chest pain, no shortness of breath, no dysuria, no diarrhea, no constipation, no emesis, no melanotic, no hematemesis, no hematochezia, no weakness. Patient states she usually gets around with the aid of a wheelchair. Patient was seen in the emergency room, compressive metabolic profile done had a BUN of 26 creatinine of 1.29 without luminal 3.0+ was within normal limits. CBC done had a  platelet count of 128 otherwise was unremarkable. Alcohol level was less than 5. Urinalysis is pending. CT head was negative for any acute abnormalities. EKG with A. fib with heart rate of 125. Hospitalists were called to admit the patient for further evaluation and management.  Assessment & Plan   Acute encephalopathy -Unknown etiology -Patient back to her baseline, alert and oriented -No focal neurological deficits -CT head negative -Chest x-ray and UA negative for infection -Troponins cycled and stable -TSH 0.006 -Vitamin B 12 542 -Pending PT evaluation  Atrial fibrillation -Currently rate controlled, continue Cardizem -Continue Eliquis for anticoagulation  Dehydration -Continue IV fluids  History of DVT -Continue anticoagulation   Acute renal failure -Resolved, creatinine currently 0.99 -Likely secondary to dehydration  Depression -Continue Zoloft and Celexa  Abnormal TSH -Will obtain free T4 level  Code Status: Full  Family Communication: None at bedside  Disposition Plan: Admitted, will check FT4, pending PT eval.  Expect d/c in 24 hours  Time Spent in minutes   30 minutes  Procedures  None  Consults   None  DVT Prophylaxis  Eliquis  Lab Results  Component Value Date   PLT 120* 04/09/2015  Medications  Scheduled Meds: . sodium chloride   Intravenous STAT  . apixaban  5 mg Oral BID  . citalopram  10 mg Oral Daily  . diltiazem  30 mg Oral QID  . feeding supplement (ENSURE ENLIVE)  237 mL Oral BID BM  . hydrocortisone cream   Topical BID  . sertraline  100 mg Oral Daily  . sodium chloride  3 mL Intravenous Q12H   Continuous Infusions: . sodium chloride 75 mL/hr at 04/09/15 0839   PRN Meds:.acetaminophen, albuterol, alum & mag hydroxide-simeth, hydrOXYzine, ondansetron **OR** ondansetron (ZOFRAN) IV, oxyCODONE, senna-docusate, sorbitol  Antibiotics    Anti-infectives    None      Subjective:   Maegan Buller seen and examined today.   Patient cannot believe what occurred to her yesterday. She cannot believe that she was unresponsive. She denies any chest pain, shortness of breath, cough, abdominal pain, dizziness or headache.  Objective:   Filed Vitals:   04/08/15 2018 04/08/15 2131 04/09/15 0604 04/09/15 0900  BP: 107/48 123/55 137/68 129/74  Pulse: 86 93 89 89  Temp: 97.5 F (36.4 C)  98.1 F (36.7 C) 98 F (36.7 C)  TempSrc:    Oral  Resp: Height:      Weight:      SpO2: 100%  96% 94%    Wt Readings from Last 3 Encounters:  04/08/15 70.3 kg (154 lb 15.7 oz)  02/19/15 79.379 kg (175 lb)  09/11/14 72.576 kg (160 lb)     Intake/Output Summary (Last 24 hours) at 04/09/15 1153 Last data filed at 04/09/15 1130  Gross per 24 hour  Intake   2770 ml  Output    500 ml  Net   2270 ml    Exam  General: Well developed, thin,  NAD, appears stated age  HEENT: NCAT, mucous membranes moist.   Cardiovascular: S1 S2 auscultated, irregular   Respiratory: Clear to auscultation bilaterally with equal chest rise  Abdomen: Soft, nontender, nondistended, + bowel sounds  Extremities: warm dry without cyanosis clubbing or edema  Neuro: AAOx3,nonfocal  Psych: Normal affect and demeanor with intact judgement and insight  Data Review   Micro Results Recent Results (from the past 240 hour(s))  MRSA PCR Screening     Status: None   Collection Time: 04/09/15  1:59 AM  Result Value Ref Range Status   MRSA by PCR NEGATIVE NEGATIVE Final    Comment:        The GeneXpert MRSA Assay (FDA approved for NASAL specimens only), is one component of a comprehensive MRSA colonization surveillance program. It is not intended to diagnose MRSA infection nor to guide or monitor treatment for MRSA infections.     Radiology Reports Dg Chest 2 View  03/20/2015   CLINICAL DATA:  Dizziness, fell backwards landing on carpet, history atrial fibrillation  EXAM: CHEST  2 VIEW  COMPARISON:  10/09/2013  FINDINGS:  Enlargement of cardiac silhouette.  Tortuous aorta.  Mediastinal contours and pulmonary vascularity otherwise normal.  Emphysematous changes without infiltrate, pleural effusion or pneumothorax.  Bones demineralized.  IMPRESSION: Enlargement of cardiac silhouette.  COPD changes.  No acute abnormalities.   Electronically Signed   By: Ulyses Southward M.D.   On: 03/20/2015 20:47   Ct Head Wo Contrast  04/08/2015   CLINICAL DATA:  Altered mental status  EXAM: CT HEAD WITHOUT CONTRAST  TECHNIQUE: Contiguous axial images were obtained from the base of the skull through the vertex without intravenous contrast.  COMPARISON:  03/20/2015  FINDINGS: Encephalomalacia in the right occipital and temporal lobes are stable. Low-density in the peripheral left parietal region is likely signal averaging with CSF. No mass effect, midline shift, or acute hemorrhage. Chronic ischemic changes in the periventricular white matter and global atrophy are stable. Chronic ischemic change at the left parietal gray-white junction is stable. Intact cranium. Mastoid air cells and visualized paranasal sinuses are clear.  IMPRESSION: No acute intracranial pathology.  Chronic changes are noted.   Electronically Signed   By: Jolaine Click M.D.   On: 04/08/2015 09:31   Ct Head Wo Contrast  03/20/2015   CLINICAL DATA:  Acute head and neck pain after fall today.  EXAM: CT HEAD WITHOUT CONTRAST  CT CERVICAL SPINE WITHOUT CONTRAST  TECHNIQUE: Multidetector CT imaging of the head and cervical spine was performed following the standard protocol without intravenous contrast. Multiplanar CT image reconstructions of the cervical spine were also generated.  COMPARISON:  CT scan of head of March 22, 2014.  FINDINGS: CT HEAD FINDINGS  Bony calvarium appears intact. Mild diffuse cortical atrophy is noted. Right occipital encephalomalacia is noted consistent with old infarction. Old left parietal infarction is noted as well. No mass effect or midline shift is  noted. Ventricular size is within normal limits. There is no evidence of mass lesion, hemorrhage or acute infarction.  CT CERVICAL SPINE FINDINGS  No fracture is noted. Severe multilevel disc degenerative disc disease is noted in the cervical spine. Grade 1 anterolisthesis of C7-T1 is noted secondary to posterior facet joint hypertrophy. Visualized lung apices are unremarkable.  IMPRESSION: Mild diffuse cortical atrophy. 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 No acute intracranial abnormality seen.  Severe multilevel degenerative disc disease is noted in the cervical spine. No fracture is noted.   Electronically Signed   By: Lupita Raider, M.D.   On: 03/20/2015 21:06   Ct Cervical Spine Wo Contrast  03/20/2015   CLINICAL DATA:  Acute head and neck pain after fall today.  EXAM: CT HEAD WITHOUT CONTRAST  CT CERVICAL SPINE WITHOUT CONTRAST  TECHNIQUE: Multidetector CT imaging of the head and cervical spine was performed following the standard protocol without intravenous contrast. Multiplanar CT image reconstructions of the cervical spine were also generated.  COMPARISON:  CT scan of head of March 22, 2014.  FINDINGS: CT HEAD FINDINGS  Bony calvarium appears intact. Mild diffuse cortical atrophy is noted. Right occipital encephalomalacia is noted consistent with old infarction. Old left parietal infarction is noted as well. No mass effect or midline shift is noted. Ventricular size is within normal limits. There is no evidence of mass lesion, hemorrhage or acute infarction.  CT CERVICAL SPINE FINDINGS  No fracture is noted. Severe multilevel disc degenerative disc disease is noted in the cervical spine. Grade 1 anterolisthesis of C7-T1 is noted secondary to posterior facet joint hypertrophy. Visualized lung apices are unremarkable.  IMPRESSION: Mild diffuse cortical atrophy. 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 No acute intracranial abnormality seen.  Severe multilevel degenerative disc  disease is noted in the cervical spine. No fracture is noted.   Electronically Signed   By: Lupita Raider, M.D.   On: 03/20/2015 21:06   Dg Chest Port 1 View  04/08/2015   CLINICAL DATA:  Altered mental status  EXAM: PORTABLE CHEST - 1 VIEW  COMPARISON:  03/05/2015  FINDINGS: Unchanged streaky opacity at the left base. No edema, effusion, or pneumothorax. Stable cardiomegaly and aortic tortuosity, distorted from leftward rotation  IMPRESSION: Unchanged retrocardiac atelectasis or bronchopneumonia.   Electronically Signed   By: Marnee Spring M.D.   On: 04/08/2015 11:25    CBC  Recent Labs Lab 04/08/15 0857 04/09/15 0510  WBC 5.4 3.4*  HGB 12.3 11.2*  HCT 37.9 33.8*  PLT 128* 120*  MCV 86.3 86.2  MCH 28.0 28.6  MCHC 32.5 33.1  RDW 14.1 14.3  LYMPHSABS 0.9  --   MONOABS 0.6  --   EOSABS 0.1  --   BASOSABS 0.0  --     Chemistries   Recent Labs Lab 04/08/15 0857 04/08/15 1850 04/09/15 0510  NA 139  --  139  K 4.2  --  3.7  CL 107  --  109  CO2 23  --  24  GLUCOSE 90  --  82  BUN 26*  --  20  CREATININE 1.29*  --  0.99  CALCIUM 8.7*  --  9.0  MG  --  1.9  --   AST 38  --  28  ALT 20  --  16  ALKPHOS 48  --  44  BILITOT 0.9  --  1.0   ------------------------------------------------------------------------------------------------------------------ estimated creatinine clearance is 45.5 mL/min (by C-G formula based on Cr of 0.99). ------------------------------------------------------------------------------------------------------------------ No results for input(s): HGBA1C in the last 72 hours. ------------------------------------------------------------------------------------------------------------------ No results for input(s): CHOL, HDL, LDLCALC, TRIG, CHOLHDL, LDLDIRECT in the last 72 hours. ------------------------------------------------------------------------------------------------------------------  Recent Labs  04/08/15 1850  TSH 0.006*    ------------------------------------------------------------------------------------------------------------------  Recent Labs  04/08/15 1850  VITAMINB12 542    Coagulation profile No results for input(s): INR, PROTIME in the last 168 hours.  No results for input(s): DDIMER in the last 72 hours.  Cardiac Enzymes  Recent Labs Lab 04/08/15 1228 04/08/15 1850 04/08/15 2259  TROPONINI 0.07* 0.07* 0.07*   ------------------------------------------------------------------------------------------------------------------ Invalid input(s): POCBNP     Prom D.O. on 04/09/2015 at 11:53 AM  Between 7am to 7pm - Pager - [phone]  After 7pm go to [email or web address] - password [email or web address]  And look for the night coverage person covering for me after hours  [facility] Office  [phone]

## 2015-04-09 NOTE — Evaluation (Signed)
Physical Therapy Evaluation Patient Details Name: Felicia Lara MRN: [id number] DOB: 1933-10-13 Today's Date: 04/09/2015   History of Present Illness  Patient is a 81 yo female admitted 04/08/15 after being found down in her ILF apartment.  Patient unresponsive.  She does not recall any of this event.  Patient with UTI.  PMH:  Afib, Depression, anemia, neuropathy  Clinical Impression  Patient presents with problems listed below.  Will benefit from acute PT to maximize functional mobility prior to discharge.  Recommend HHPT at ILF, and continuation of HH Aide.    Follow Up Recommendations Home health PT;Supervision - Intermittent (Continue HH Aide)    Equipment Recommendations  None recommended by PT    Recommendations for Other Services       Precautions / Restrictions Precautions Precautions: Fall Restrictions Weight Bearing Restrictions: No      Mobility  Bed Mobility Overal bed mobility: Needs Assistance Bed Mobility: Sit to Supine       Sit to supine: Supervision   General bed mobility comments: Supervision for safety.  No physical assist needed.  Transfers Overall transfer level: Needs assistance Equipment used: Rolling walker (2 wheeled) Transfers: Sit to/from Stand Sit to Stand: Min guard         General transfer comment: Min guard for safety.  No physical assist needed.  Ambulation/Gait Ambulation/Gait assistance: Min guard Ambulation Distance (Feet): 32 Feet Assistive device: Rolling walker (2 wheeled) Gait Pattern/deviations: Step-through pattern;Decreased stride length;Trunk flexed Gait velocity: decreased Gait velocity interpretation: Below normal speed for age/gender General Gait Details: Patient demonstrates safe use of RW.  Patient with slow, steady gait with RW.  Assist for safety.  Stairs            Wheelchair Mobility    Modified Rankin (Stroke Patients Only)       Balance                                             Pertinent Vitals/Pain Pain Assessment: No/denies pain    Home Living Family/patient expects to be discharged to:: Other (Comment) (ILF - [facility]) Living Arrangements: Alone [facility] apartment) Available Help at Discharge: Available PRN/intermittently (ILF staff) Type of Home: Independent living facility Home Access: Level entry;Elevator     Home Layout: One level Home Equipment: Environmental consultant - 4 wheels Additional Comments: [facility] - Aide for ADL's; ILF provides meals in dining area, housekeeping, and med management per patient.    Prior Function Level of Independence: Independent with assistive device(s);Needs assistance   Gait / Transfers Assistance Needed: Uses rollator - ambulates to dining room.  Goes out with friends with rollator.  ADL's / Homemaking Assistance Needed: ILF provides housekeeping and meals.        Hand Dominance        Extremity/Trunk Assessment   Upper Extremity Assessment: Generalized weakness           Lower Extremity Assessment: Generalized weakness         Communication   Communication: No difficulties  Cognition Arousal/Alertness: Awake/alert Behavior During Therapy: WFL for tasks assessed/performed;Anxious Overall Cognitive Status: No family/caregiver present to determine baseline cognitive functioning (Answers to questions appeared appropriate)                      General Comments      Exercises        Assessment/Plan  PT Assessment Patient needs continued PT services  PT Diagnosis Abnormality of gait;Generalized weakness   PT Problem List Decreased strength;Decreased activity tolerance;Decreased balance;Decreased mobility  PT Treatment Interventions DME instruction;Gait training;Functional mobility training;Therapeutic activities;Patient/family education   PT Goals (Current goals can be found in the Care Plan section) Acute Rehab PT Goals Patient Stated Goal: To go back home PT Goal Formulation: With  patient Time For Goal Achievement: 04/16/15 Potential to Achieve Goals: Good    Frequency Min 3X/week   Barriers to discharge Decreased caregiver support Patient lives alone in ILF apartment    Co-evaluation               End of Session Equipment Utilized During Treatment: Gait belt Activity Tolerance: Patient limited by fatigue Patient left: in bed;with call bell/phone within reach Nurse Communication: Mobility status         Time: 1610-9604 PT Time Calculation (min) (ACUTE ONLY): 17 min   Charges:   PT Evaluation $Initial PT Evaluation Tier I: 1 Procedure     PT G CodesVena Austria 05-08-2015, 8:04 PM Durenda Hurt. Renaldo Fiddler, [facility] Acute Rehab Services Pager [phone]

## 2015-04-10 LAB — FOLATE RBC
FOLATE, RBC: 1016 ng/mL (ref >498)
Folate, Hemolysate: 337.2 ng/mL
HEMATOCRIT: 33.2 % — AB (ref 34.0–46.6)

## 2015-04-10 LAB — T4, FREE: Free T4: 1.25 ng/dL — ABNORMAL HIGH (ref 0.61–1.12)

## 2015-04-10 LAB — URINE CULTURE: Culture: >=100000

## 2015-04-10 NOTE — Discharge Instructions (Signed)

## 2015-04-10 NOTE — Progress Notes (Signed)
Discharge instructions and medications discussed with patient.  All questions answered. Called and spoke with patient's daughter-in-law, Lupita Leash and informed her of patient's discharge.

## 2015-04-10 NOTE — Discharge Summary (Signed)
Physician Discharge Summary  Felicia Lara WUJ:[id number] DOB: [birth year] DOA: 04/08/2015  PCP: Pcp Not In System  Admit date: 04/08/2015 Discharge date: 04/10/2015  Time spent: 45 minutes  Recommendations for Outpatient Follow-up:  Patient will be discharged to home with home health PT.  Patient will need to follow up with primary care provider within one week of discharge, repeat CBC and BMP, discuss hypothyroidism.  Patient should continue medications as prescribed.  Patient should follow a Regular diet.   Discharge Diagnoses:  Acute cephalopathy Atrial fibrillation Dehydration  history of DVT Acute renal failure Depression  Hypothyroidism  Discharge Condition: Stable  Diet recommendation: regular  Filed Weights   04/08/15 1651 04/09/15 2129 04/10/15 0500  Weight: 70.3 kg (154 lb 15.7 oz) 70.3 kg (154 lb 15.7 oz) 70.3 kg (154 lb 15.7 oz)    History of present illness:  on 04/08/2015 by Dr. Ramiro Lara Felicia Lara is a 81 y.o. female with history of atrial fibrillation on chronic anticoagulation, prior history of DVT, depression, anemia who lives in an independent living facility who presented to the ED with altered mental status. Patient is currently alert however unable to tell what happened. Per ED physician who spoke Felicia Lara, manager of independent living facility it was stated that the staff let themselves and to patient's apartment on the morning of admission and at that time noted that water was running from the bathtub in the apartment was extremely hot and humid with steam dripping from the walls. Patient was found laying on a bed naked and unresponsive. Oxygen was applied and patient's initial Glasgow Coma Score was 8. Patient was subsequently brought to the ED. Patient improved symptomatically in the emergency room that at the time of my evaluation a Glasgow coma score was 15. Patient is unable to recall what happened. Patient at the time of the interview is alert  and oriented 3. Patient denies any fevers, no chills, no nausea, no vomiting, no abdominal pain, no chest pain, no shortness of breath, no dysuria, no diarrhea, no constipation, no emesis, no melanotic, no hematemesis, no hematochezia, no weakness. Patient states she usually gets around with the aid of a wheelchair. Patient was seen in the emergency room, compressive metabolic profile done had a BUN of 26 creatinine of 1.29 without luminal 3.0+ was within normal limits. CBC done had a platelet count of 128 otherwise was unremarkable. Alcohol level was less than 5. Urinalysis is pending. CT head was negative for any acute abnormalities. EKG with A. fib with heart rate of 125. Hospitalists were called to admit the patient for further evaluation and management.  Hospital Course:  Acute encephalopathy -Unknown etiology -Patient back to her baseline, alert and oriented -No focal neurological deficits -CT head negative -Chest x-ray and UA negative for infection -Troponins cycled and stable -TSH 0.008, Ft4 WNL -Vitamin B 12 542 -PT consulted and recommended home health  Atrial fibrillation -Currently rate controlled, continue Cardizem -Continue Eliquis for anticoagulation  Dehydration -Continue IV fluids  History of DVT -Continue anticoagulation  Acute renal failure -Resolved, creatinine currently 0.99 -Likely secondary to dehydration  Depression -Continue Zoloft and Celexa  Hypothyroidism -TSH 0.006, FT4 1.25 -Patient should continue synthroid and follow up with PCP   Procedures  None  Consults  None  Discharge Exam: Filed Vitals:   04/10/15 0443  BP: 136/52  Pulse: 75  Temp: 97.4 F (36.3 C)  Resp: 18   Exam  General: Well developed, thin, NAD  HEENT: NCAT, mucous membranes moist.  Cardiovascular: S1 S2 auscultated, irregular  Respiratory: Clear to auscultation   Abdomen: Soft, nontender, nondistended, + bowel sounds  Extremities: warm dry without  cyanosis clubbing or edema  Neuro: AAOx3,nonfocal  Psych: Normal affect and demeanor, pleasant  Discharge Instructions      Discharge Instructions    Discharge instructions    Complete by:  As directed   Patient will be discharged to home with home health PT.  Patient will need to follow up with primary care provider within one week of discharge, repeat CBC and BMP, discuss hypothyroidism.  Patient should continue medications as prescribed.  Patient should follow a Regular diet.            Medication List    STOP taking these medications        hydrochlorothiazide 25 MG tablet  Commonly known as:  HYDRODIURIL     hydrOXYzine 10 MG tablet  Commonly known as:  ATARAX/VISTARIL     sertraline 100 MG tablet  Commonly known as:  ZOLOFT      TAKE these medications        acetaminophen 325 MG tablet  Commonly known as:  TYLENOL  Take 650 mg by mouth every 6 (six) hours as needed (pain).     atorvastatin 40 MG tablet  Commonly known as:  LIPITOR  Take 40 mg by mouth daily.     divalproex 500 MG DR tablet  Commonly known as:  DEPAKOTE  Take 500 mg by mouth 2 (two) times daily.     ELIQUIS 5 MG Tabs tablet  Generic drug:  apixaban  Take 5 mg by mouth 2 (two) times daily.     famotidine 20 MG tablet  Commonly known as:  PEPCID  Take 20 mg by mouth 2 (two) times daily.     ferrous sulfate 325 (65 FE) MG tablet  Take 325 mg by mouth 2 (two) times daily with a meal.     gabapentin 100 MG capsule  Commonly known as:  NEURONTIN  Take 200 mg by mouth at bedtime.     hydrocortisone 2.5 % cream  Apply topically 2 (two) times daily.     levothyroxine 25 MCG tablet  Commonly known as:  SYNTHROID, LEVOTHROID  Take 25 mcg by mouth daily.     oxybutynin 5 MG tablet  Commonly known as:  DITROPAN  Take 5 mg by mouth daily.     QUEtiapine 25 MG tablet  Commonly known as:  SEROQUEL  Take 25 mg by mouth at bedtime.       Allergies  Allergen Reactions  . Ibuprofen  Anaphylaxis    Anaphylaxis reaction reported by [facility] June 2016   Follow-up Information    Follow up with PCP. Schedule an appointment as soon as possible for a visit in 1 week.   Why:  Hospital follow up       The results of significant diagnostics from this hospitalization (including imaging, microbiology, ancillary and laboratory) are listed below for reference.    Significant Diagnostic Studies: Dg Chest 2 View  03/20/2015   CLINICAL DATA:  Dizziness, fell backwards landing on carpet, history atrial fibrillation  EXAM: CHEST  2 VIEW  COMPARISON:  10/09/2013  FINDINGS: Enlargement of cardiac silhouette.  Tortuous aorta.  Mediastinal contours and pulmonary vascularity otherwise normal.  Emphysematous changes without infiltrate, pleural effusion or pneumothorax.  Bones demineralized.  IMPRESSION: Enlargement of cardiac silhouette.  COPD changes.  No acute abnormalities.   Electronically Signed  By: Ulyses Southward M.D.   On: 03/20/2015 20:47   Ct Head Wo Contrast  04/08/2015   CLINICAL DATA:  Altered mental status  EXAM: CT HEAD WITHOUT CONTRAST  TECHNIQUE: Contiguous axial images were obtained from the base of the skull through the vertex without intravenous contrast.  COMPARISON:  03/20/2015  FINDINGS: Encephalomalacia in the right occipital and temporal lobes are stable. Low-density in the peripheral left parietal region is likely signal averaging with CSF. No mass effect, midline shift, or acute hemorrhage. Chronic ischemic changes in the periventricular white matter and global atrophy are stable. Chronic ischemic change at the left parietal gray-white junction is stable. Intact cranium. Mastoid air cells and visualized paranasal sinuses are clear.  IMPRESSION: No acute intracranial pathology.  Chronic changes are noted.   Electronically Signed   By: Jolaine Click M.D.   On: 04/08/2015 09:31   Ct Head Wo Contrast  03/20/2015   CLINICAL DATA:  Acute head and neck pain after fall today.   EXAM: CT HEAD WITHOUT CONTRAST  CT CERVICAL SPINE WITHOUT CONTRAST  TECHNIQUE: Multidetector CT imaging of the head and cervical spine was performed following the standard protocol without intravenous contrast. Multiplanar CT image reconstructions of the cervical spine were also generated.  COMPARISON:  CT scan of head of March 22, 2014.  FINDINGS: CT HEAD FINDINGS  Bony calvarium appears intact. Mild diffuse cortical atrophy is noted. Right occipital encephalomalacia is noted consistent with old infarction. Old left parietal infarction is noted as well. No mass effect or midline shift is noted. Ventricular size is within normal limits. There is no evidence of mass lesion, hemorrhage or acute infarction.  CT CERVICAL SPINE FINDINGS  No fracture is noted. Severe multilevel disc degenerative disc disease is noted in the cervical spine. Grade 1 anterolisthesis of C7-T1 is noted secondary to posterior facet joint hypertrophy. Visualized lung apices are unremarkable.  IMPRESSION: Mild diffuse cortical atrophy. 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 No acute intracranial abnormality seen.  Severe multilevel degenerative disc disease is noted in the cervical spine. No fracture is noted.   Electronically Signed   By: Lupita Raider, M.D.   On: 03/20/2015 21:06   Ct Cervical Spine Wo Contrast  03/20/2015   CLINICAL DATA:  Acute head and neck pain after fall today.  EXAM: CT HEAD WITHOUT CONTRAST  CT CERVICAL SPINE WITHOUT CONTRAST  TECHNIQUE: Multidetector CT imaging of the head and cervical spine was performed following the standard protocol without intravenous contrast. Multiplanar CT image reconstructions of the cervical spine were also generated.  COMPARISON:  CT scan of head of March 22, 2014.  FINDINGS: CT HEAD FINDINGS  Bony calvarium appears intact. Mild diffuse cortical atrophy is noted. Right occipital encephalomalacia is noted consistent with old infarction. Old left parietal infarction is noted  as well. No mass effect or midline shift is noted. Ventricular size is within normal limits. There is no evidence of mass lesion, hemorrhage or acute infarction.  CT CERVICAL SPINE FINDINGS  No fracture is noted. Severe multilevel disc degenerative disc disease is noted in the cervical spine. Grade 1 anterolisthesis of C7-T1 is noted secondary to posterior facet joint hypertrophy. Visualized lung apices are unremarkable.  IMPRESSION: Mild diffuse cortical atrophy. 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 No acute intracranial abnormality seen.  Severe multilevel degenerative disc disease is noted in the cervical spine. No fracture is noted.   Electronically Signed   By: Lupita Raider, M.D.   On: 03/20/2015  21:06   Dg Chest Port 1 View  04/08/2015   CLINICAL DATA:  Altered mental status  EXAM: PORTABLE CHEST - 1 VIEW  COMPARISON:  03/05/2015  FINDINGS: Unchanged streaky opacity at the left base. No edema, effusion, or pneumothorax. Stable cardiomegaly and aortic tortuosity, distorted from leftward rotation  IMPRESSION: Unchanged retrocardiac atelectasis or bronchopneumonia.   Electronically Signed   By: Marnee Spring M.D.   On: 04/08/2015 11:25    Microbiology: Recent Results (from the past 240 hour(s))  Culture, Urine     Status: None (Preliminary result)   Collection Time: 04/08/15 11:13 AM  Result Value Ref Range Status   Specimen Description URINE, RANDOM  Final   Special Requests NONE  Final   Culture CULTURE REINCUBATED FOR BETTER GROWTH  Final   Report Status PENDING  Incomplete  MRSA PCR Screening     Status: None   Collection Time: 04/09/15  1:59 AM  Result Value Ref Range Status   MRSA by PCR NEGATIVE NEGATIVE Final    Comment:        The GeneXpert MRSA Assay (FDA approved for NASAL specimens only), is one component of a comprehensive MRSA colonization surveillance program. It is not intended to diagnose MRSA infection nor to guide or monitor treatment for MRSA  infections.   C difficile quick scan w PCR reflex     Status: None   Collection Time: 04/09/15  6:23 PM  Result Value Ref Range Status   C Diff antigen NEGATIVE NEGATIVE Final   C Diff toxin NEGATIVE NEGATIVE Final   C Diff interpretation Negative for toxigenic C. difficile  Final     Labs: Basic Metabolic Panel:  Recent Labs Lab 04/08/15 0857 04/08/15 1850 04/09/15 0510  NA 139  --  139  K 4.2  --  3.7  CL 107  --  109  CO2 23  --  24  GLUCOSE 90  --  82  BUN 26*  --  20  CREATININE 1.29*  --  0.99  CALCIUM 8.7*  --  9.0  MG  --  1.9  --    Liver Function Tests:  Recent Labs Lab 04/08/15 0857 04/09/15 0510  AST 38 28  ALT 20 16  ALKPHOS 48 44  BILITOT 0.9 1.0  PROT 6.1* 5.8*  ALBUMIN 3.0* 2.7*   No results for input(s): LIPASE, AMYLASE in the last 168 hours. No results for input(s): AMMONIA in the last 168 hours. CBC:  Recent Labs Lab 04/08/15 0857 04/09/15 0510  WBC 5.4 3.4*  NEUTROABS 3.9  --   HGB 12.3 11.2*  HCT 37.9 33.8*  MCV 86.3 86.2  PLT 128* 120*   Cardiac Enzymes:  Recent Labs Lab 04/08/15 0857 04/08/15 1228 04/08/15 1850 04/08/15 2259  CKTOTAL 72  --   --   --   TROPONINI  --  0.07* 0.07* 0.07*   BNP: BNP (last 3 results)  Recent Labs  02/19/15 1305  BNP 187.2*    ProBNP (last 3 results) No results for input(s): PROBNP in the last 8760 hours.  CBG: No results for input(s): GLUCAP in the last 168 hours.     SignedEdsel Petrin  [facility] 04/10/2015, 10:13 AM

## 2015-04-10 NOTE — Evaluation (Signed)
Occupational Therapy Evaluation Patient Details Name: Felicia Lara MRN: [id number] DOB: 1933-12-22 Today's Date: 04/10/2015    History of Present Illness Patient is a 81 yo female admitted 04/08/15 after being found down in her ILF apartment.  Patient unresponsive.  She does not recall any of this event.  Patient with UTI.  PMH:  Afib, Depression, anemia, neuropathy   Clinical Impression   Patient presenting with deconditioning and decreased ADL & functional mobility independence. Patient mod I PTA. Patient currently functioning at an overall min assist level. Patient will benefit from acute OT to increase overall independence in the areas of ADLs, functional mobility, and overall safety in order to safely discharge back to her independent living apartment with 24/7 supervision/assistance and HHOT. Pt with pretty significant posterior lean when standing with this OT and demonstrates unsafe use of RW. At this time, pt unsafe to discharge home without 24/7 supervision/assistance.     Follow Up Recommendations  Home health OT;Supervision/Assistance - 24 hour    Equipment Recommendations  None recommended by OT    Recommendations for Other Services  None at this time    Precautions / Restrictions Precautions Precautions: Fall Restrictions Weight Bearing Restrictions: No    Mobility Bed Mobility General bed mobility comments: Pt found seated in recliner upon OT entering/exiting room.  Transfers Overall transfer level: Needs assistance Equipment used: Rolling walker (2 wheeled) Transfers: Sit to/from Stand Sit to Stand: Min assist         General transfer comment: Pt with posterior lean when performing sit<>stands and needs min assist for safety. Cues needed for hand placement, sequencing, and safety.     Balance Overall balance assessment: Needs assistance Sitting-balance support: No upper extremity supported;Feet supported Sitting balance-Leahy Scale: Fair     Standing  balance support: Bilateral upper extremity supported;During functional activity Standing balance-Leahy Scale: Fair Standing balance comment: Pt with significant posterior lean    ADL Overall ADL's : Needs assistance/impaired General ADL Comments: Pt requires overall min assist with ADLs and functional mobility using RW. Pt states she has a rollator at home for functional mobility. Pt with decreased safety awareness using RW in hospital room. Pt pushing RW out of way and wanting to leave it outside BR door during practice with toilet transfer. Moderate verbal and tactile cueing needed for safety with RW.   IF therapist wouldn't have been there to assist with toilet transfer, pt would have fallen while trying to get onto toilet.     Pertinent Vitals/Pain Pain Assessment: No/denies pain     Hand Dominance Right   Extremity/Trunk Assessment Upper Extremity Assessment Upper Extremity Assessment: Generalized weakness   Lower Extremity Assessment Lower Extremity Assessment: Generalized weakness   Cervical / Trunk Assessment Cervical / Trunk Assessment: Kyphotic   Communication Communication Communication: No difficulties   Cognition Arousal/Alertness: Awake/alert Behavior During Therapy: WFL for tasks assessed/performed;Anxious Overall Cognitive Status: No family/caregiver present to determine baseline cognitive functioning (appears appropriate)              Home Living Family/patient expects to be discharged to:: Other (Comment) (ILF - [facility]) Living Arrangements: Alone Available Help at Discharge: Available PRN/intermittently (ILF staff) Type of Home: Independent living facility Home Access: Level entry;Elevator     Home Layout: One level Home Equipment: Walker - 4 wheels   Additional Comments: [facility] - Aide for ADL's; ILF provides meals in dining area, housekeeping, and med management per patient.      Prior Functioning/Environment Level of Independence: Independent  with assistive  device(s);Needs assistance  Gait / Transfers Assistance Needed: Uses rollator - ambulates to dining room.  Goes out with friends with rollator. ADL's / Homemaking Assistance Needed: ILF provides housekeeping and meals.        OT Diagnosis: Generalized weakness   OT Problem List: Decreased strength;Decreased activity tolerance;Impaired balance (sitting and/or standing);Decreased safety awareness;Decreased knowledge of use of DME or AE;Decreased knowledge of precautions   OT Treatment/Interventions: Self-care/ADL training;Therapeutic exercise;Energy conservation;DME and/or AE instruction;Therapeutic activities;Patient/family education;Balance training    OT Goals(Current goals can be found in the care plan section) Acute Rehab OT Goals Patient Stated Goal: To go back home OT Goal Formulation: With patient Time For Goal Achievement: 04/24/15 Potential to Achieve Goals: Good ADL Goals Pt Will Perform Lower Body Bathing: with supervision;sit to/from stand Pt Will Perform Lower Body Dressing: with supervision;sit to/from stand Pt Will Transfer to Toilet: with supervision;ambulating (elevated toilet seat) Pt Will Perform Tub/Shower Transfer: Shower transfer;ambulating;shower seat;rolling walker;with supervision Pt/caregiver will Perform Home Exercise Program: Increased strength;Both right and left upper extremity;With theraband;With written HEP provided;With Supervision  OT Frequency: Min 2X/week   Barriers to D/C: Decreased caregiver support   End of Session Equipment Utilized During Treatment: Rolling walker  Activity Tolerance: Patient tolerated treatment well Patient left: in chair;with call bell/phone within reach   Time: 1914-7829 OT Time Calculation (min): 14 min Charges:  OT General Charges $OT Visit: 1 Procedure OT Evaluation $Initial OT Evaluation Tier I: 1 Procedure Dillard Pascal , MS, OTR/L, CLT Pager: [phone]  04/10/2015, 2:49 PM

## 2015-04-10 NOTE — Clinical Social Work Note (Signed)
Patient ready for discharge and will be transported home by ambulance per family request.  Genelle Bal, MSW, LCSW Licensed Clinical Social Worker Clinical Social Work Department [employer] [phone]

## 2015-04-10 NOTE — Progress Notes (Signed)
ED CM spoke with Felicia Lara on 6E regarding patient returning to the [facility] in [place] with resumption of [facility]. Patient was receiving HH services at the facility according to family. [facility] was contacted about transport this evening. As per [name] RN family aware. No further CM needs identified.

## 2015-04-10 NOTE — Progress Notes (Signed)
Physical Therapy Treatment Patient Details Name: Felicia Lara MRN: [id number] DOB: 1934-05-03 Today's Date: 04/10/2015    History of Present Illness Patient is a 81 yo female admitted 04/08/15 after being found down in her [facility] apartment.  Patient unresponsive.  She does not recall any of this event.  Patient with UTI.  PMH:  Afib, Depression, anemia, neuropathy    PT Comments    Able to tolerate more activity this session, with incr amb distance; Discussed resuming HH therapies (PT and OT)  Follow Up Recommendations  Home health PT;Supervision - Intermittent (resumption of [facility]ide)     Equipment Recommendations  None recommended by PT    Recommendations for Other Services       Precautions / Restrictions Precautions Precautions: Fall    Mobility  Bed Mobility                  Transfers Overall transfer level: Needs assistance Equipment used: Rolling walker (2 wheeled) Transfers: Sit to/from Stand Sit to Stand: Min guard         General transfer comment: Min guard for safety.  No physical assist needed. Noted soem decr control of descent to sit after walk  Ambulation/Gait Ambulation/Gait assistance: Min guard (with and without physical contact) Ambulation Distance (Feet): 150 Feet Assistive device: Rolling walker (2 wheeled) Gait Pattern/deviations: Step-through pattern;Trunk flexed Gait velocity: decreased   General Gait Details: Patient demonstrates safe use of RW.  Patient with slow, steady gait with RW.  Assist for safety. occasional cues for posture   Stairs            Wheelchair Mobility    Modified Rankin (Stroke Patients Only)       Balance Overall balance assessment: Needs assistance             Standing balance comment: benefits form UE support                    Cognition Arousal/Alertness: Awake/alert Behavior During Therapy: WFL for tasks assessed/performed;Anxious Overall Cognitive Status: No family/caregiver  present to determine baseline cognitive functioning (Answers to questions appeared appropriate)                      Exercises      General Comments        Pertinent Vitals/Pain Pain Assessment: No/denies pain    Home Living                      Prior Function            PT Goals (current goals can now be found in the care plan section) Acute Rehab PT Goals Patient Stated Goal: To go back home PT Goal Formulation: With patient Time For Goal Achievement: 04/16/15 Potential to Achieve Goals: Good Progress towards PT goals: Progressing toward goals    Frequency  Min 3X/week    PT Plan Current plan remains appropriate    Co-evaluation             End of Session Equipment Utilized During Treatment: Gait belt Activity Tolerance: Patient tolerated treatment well Patient left: in chair;with call bell/phone within reach     Time: 1027-1049 PT Time Calculation (min) (ACUTE ONLY): 22 min  Charges:  $Gait Training: 8-22 mins                    G Codes:      Olen Pel 04/10/2015, 12:56 PM  [place]  Gregary Cromer, [place] Pager [phone] Office [phone]

## 2015-04-12 LAB — TSH: TSH: <0.01 u[IU]/mL — ABNORMAL LOW (ref 0.350–4.500)

## 2015-08-28 ENCOUNTER — Telehealth: Payer: Self-pay

## 2015-08-28 NOTE — Telephone Encounter (Signed)
Caller name: opened in error Relationship to patient: Can be reached: Pharmacy:  Reason for call:

## 2015-09-04 IMAGING — US US EXTREM LOW VENOUS BILAT
1 series · 13 of 24 positions shown · non-contrast
Comparison: None.

CLINICAL DATA: Bilateral calf swelling today.  History of DVT.



[Series 1: us extrem low venous bilat · 0.08mm/px · 13 of 62 slices shown]
[im 1/62]
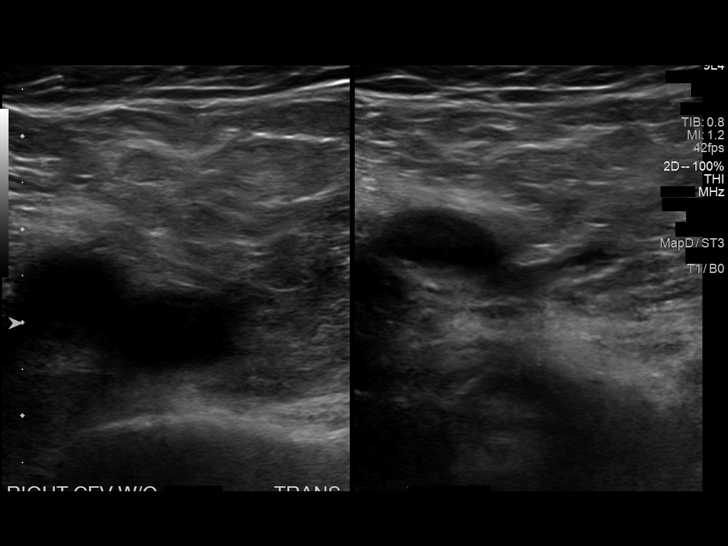
[im 6/62]
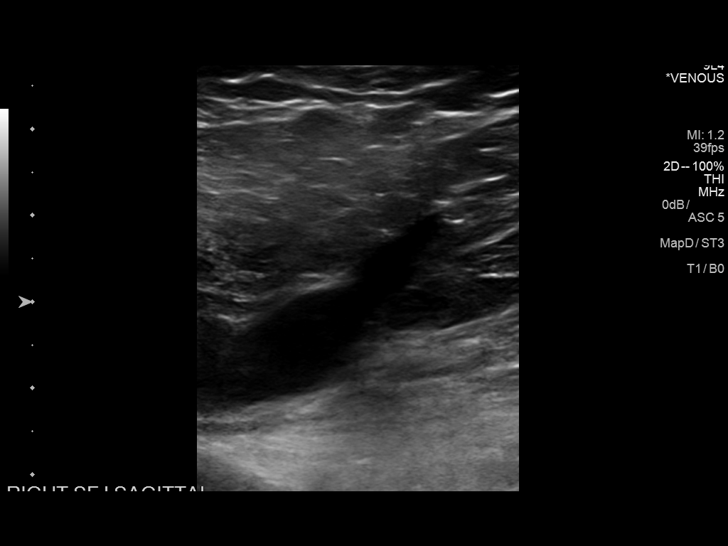
[im 11/62]
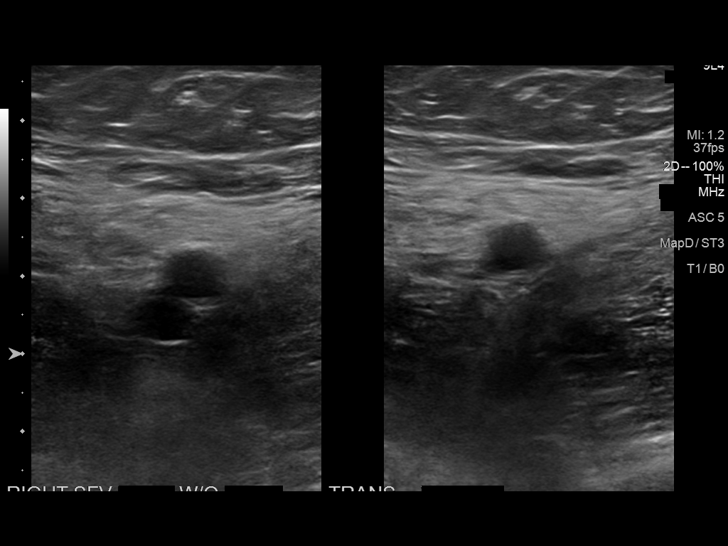
[im 16/62]
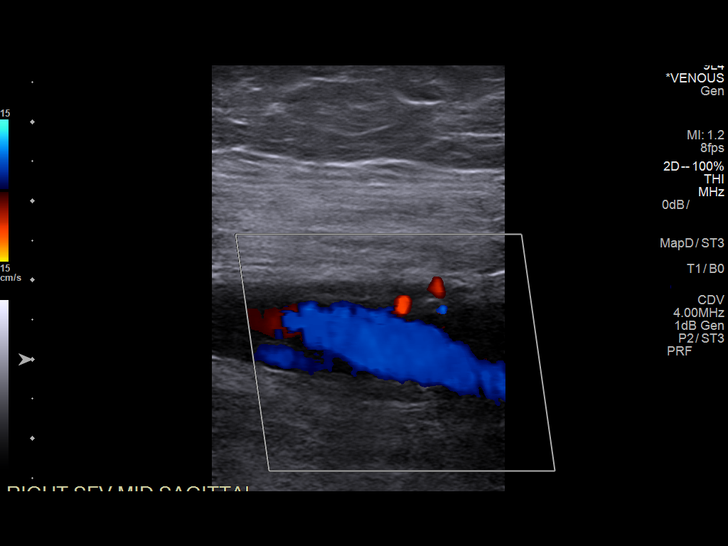
[im 22/62]
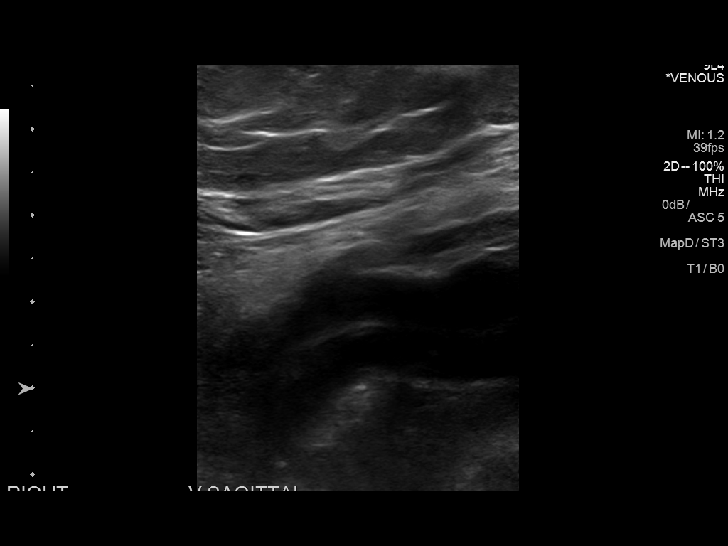
[im 27/62]
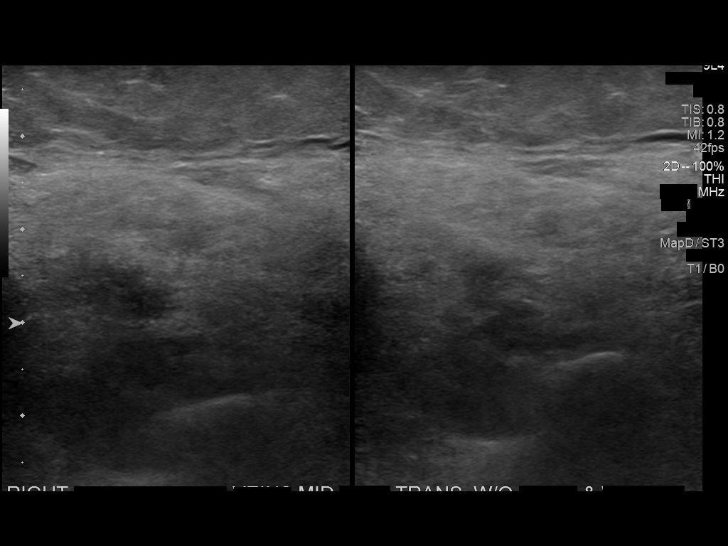
[im 32/62]
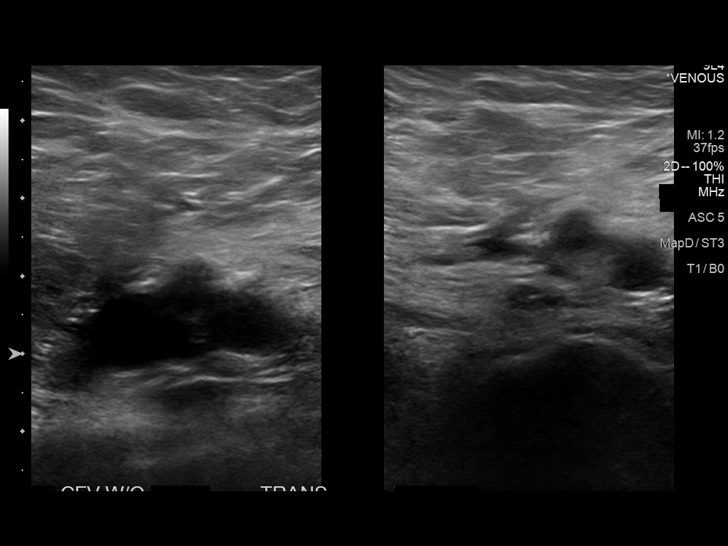
[im 35/62]
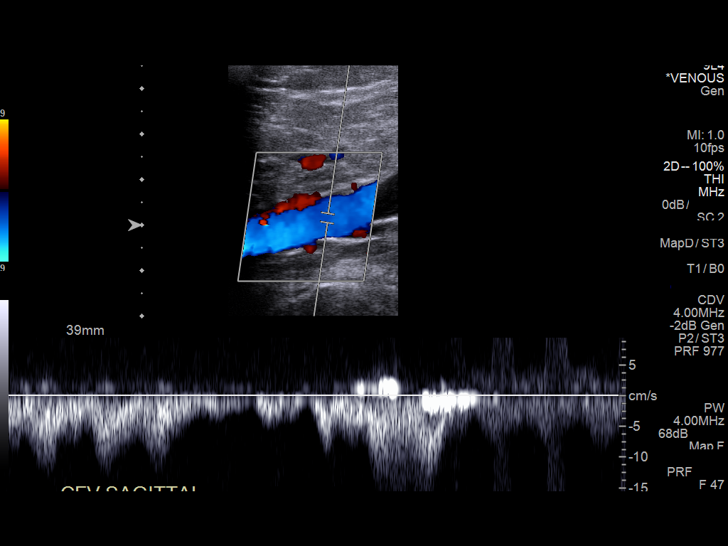
[im 40/62]
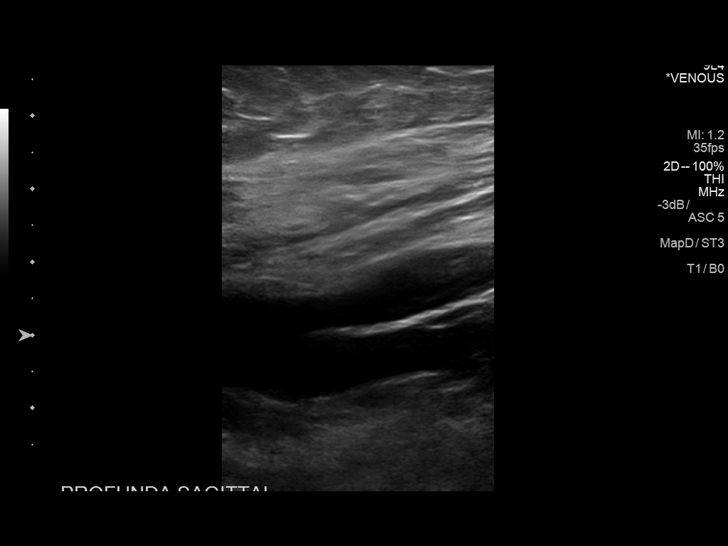
[im 46/62]
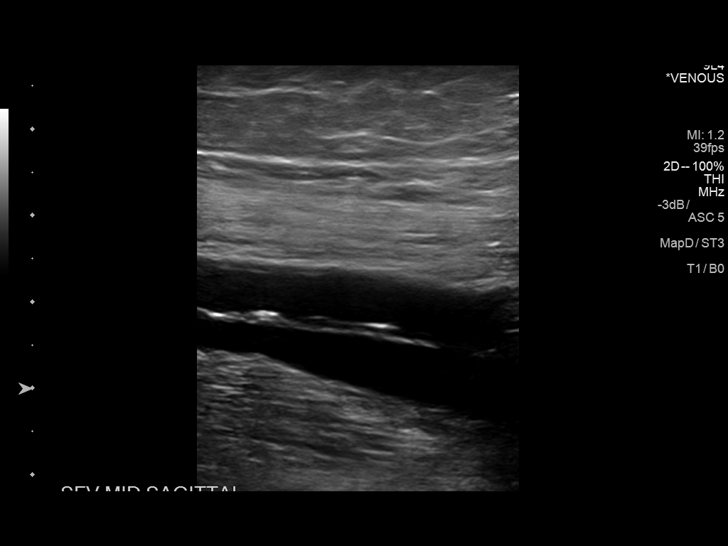
[im 51/62]
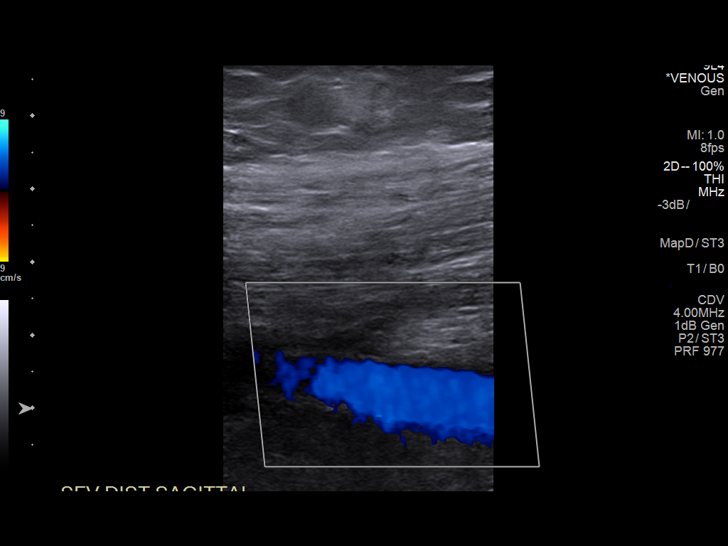
[im 56/62]
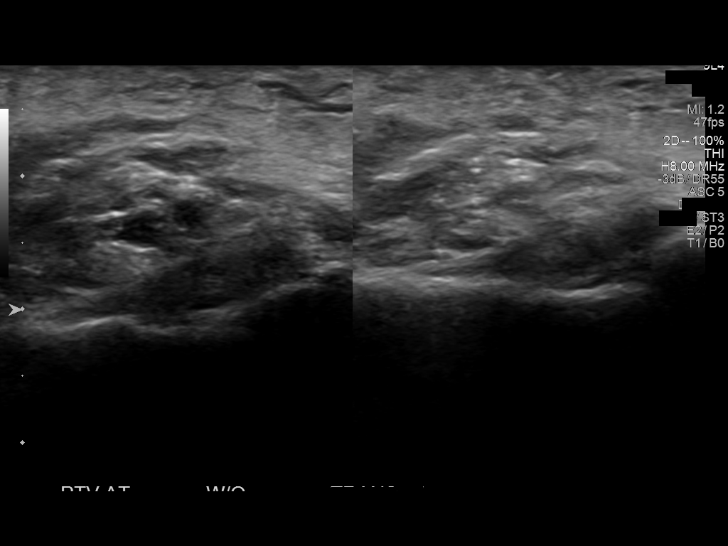
[im 62/62]
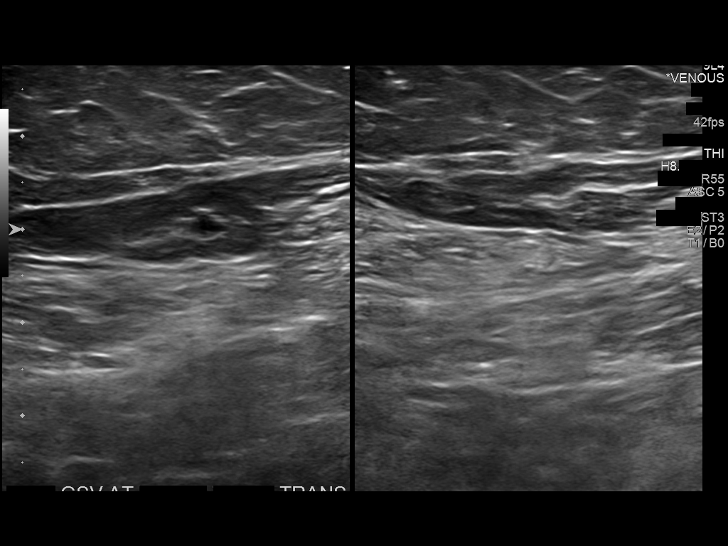

[13 of 24 positions shown; findings below may reference images not displayed]

FINDINGS: RIGHT LOWER EXTREMITY

Common Femoral Vein: No evidence of thrombus. Normal
compressibility, respiratory phasicity and response to augmentation.

Saphenofemoral Junction: No evidence of thrombus. Normal
compressibility and flow on color Doppler imaging.

Profunda Femoral Vein: No evidence of thrombus. Normal
compressibility and flow on color Doppler imaging.

Femoral Vein: No evidence of thrombus. Normal compressibility,
respiratory phasicity and response to augmentation.

Popliteal Vein: No evidence of thrombus. Normal compressibility,
respiratory phasicity and response to augmentation.

Calf Veins: No evidence of thrombus. Normal compressibility and flow
on color Doppler imaging.

Superficial Great Saphenous Vein: No evidence of thrombus. Normal
compressibility and flow on color Doppler imaging.

Venous Reflux:  None.

Other Findings:  None.

LEFT LOWER EXTREMITY

Common Femoral Vein: No evidence of thrombus. Normal
compressibility, respiratory phasicity and response to augmentation.

Saphenofemoral Junction: No evidence of thrombus. Normal
compressibility and flow on color Doppler imaging.

Profunda Femoral Vein: No evidence of thrombus. Normal
compressibility and flow on color Doppler imaging.

Femoral Vein: No evidence of thrombus. Normal compressibility,
respiratory phasicity and response to augmentation.

Popliteal Vein: No evidence of thrombus. Normal compressibility,
respiratory phasicity and response to augmentation.

Calf Veins: No evidence of thrombus. Normal compressibility and flow
on color Doppler imaging.

Superficial Great Saphenous Vein: No evidence of thrombus. Normal
compressibility and flow on color Doppler imaging.

Venous Reflux:  None.

Other Findings:  None.
IMPRESSION: No evidence of deep venous thrombosis.

## 2018-08-11 DIAGNOSIS — E039 Hypothyroidism, unspecified: Secondary | ICD-10-CM | POA: Diagnosis not present

## 2018-08-11 DIAGNOSIS — F319 Bipolar disorder, unspecified: Secondary | ICD-10-CM | POA: Diagnosis not present

## 2018-08-11 DIAGNOSIS — I4891 Unspecified atrial fibrillation: Secondary | ICD-10-CM | POA: Diagnosis not present

## 2018-08-11 DIAGNOSIS — F419 Anxiety disorder, unspecified: Secondary | ICD-10-CM | POA: Diagnosis not present

## 2018-08-19 DIAGNOSIS — F419 Anxiety disorder, unspecified: Secondary | ICD-10-CM | POA: Diagnosis not present

## 2018-08-19 DIAGNOSIS — F333 Major depressive disorder, recurrent, severe with psychotic symptoms: Secondary | ICD-10-CM | POA: Diagnosis not present

## 2018-08-19 DIAGNOSIS — F319 Bipolar disorder, unspecified: Secondary | ICD-10-CM | POA: Diagnosis not present

## 2018-08-25 DIAGNOSIS — I739 Peripheral vascular disease, unspecified: Secondary | ICD-10-CM | POA: Diagnosis not present

## 2018-08-25 DIAGNOSIS — F319 Bipolar disorder, unspecified: Secondary | ICD-10-CM | POA: Diagnosis not present

## 2018-08-25 DIAGNOSIS — E785 Hyperlipidemia, unspecified: Secondary | ICD-10-CM | POA: Diagnosis not present

## 2018-08-25 DIAGNOSIS — F419 Anxiety disorder, unspecified: Secondary | ICD-10-CM | POA: Diagnosis not present

## 2018-09-08 DIAGNOSIS — E039 Hypothyroidism, unspecified: Secondary | ICD-10-CM | POA: Diagnosis not present

## 2018-09-08 DIAGNOSIS — F419 Anxiety disorder, unspecified: Secondary | ICD-10-CM | POA: Diagnosis not present

## 2018-09-08 DIAGNOSIS — I739 Peripheral vascular disease, unspecified: Secondary | ICD-10-CM | POA: Diagnosis not present

## 2018-09-08 DIAGNOSIS — F319 Bipolar disorder, unspecified: Secondary | ICD-10-CM | POA: Diagnosis not present

## 2018-09-10 DIAGNOSIS — R451 Restlessness and agitation: Secondary | ICD-10-CM | POA: Diagnosis not present

## 2018-09-10 DIAGNOSIS — F419 Anxiety disorder, unspecified: Secondary | ICD-10-CM | POA: Diagnosis not present

## 2018-09-10 DIAGNOSIS — F333 Major depressive disorder, recurrent, severe with psychotic symptoms: Secondary | ICD-10-CM | POA: Diagnosis not present

## 2018-09-10 DIAGNOSIS — F319 Bipolar disorder, unspecified: Secondary | ICD-10-CM | POA: Diagnosis not present

## 2018-09-22 DIAGNOSIS — F419 Anxiety disorder, unspecified: Secondary | ICD-10-CM | POA: Diagnosis not present

## 2018-09-22 DIAGNOSIS — F0151 Vascular dementia with behavioral disturbance: Secondary | ICD-10-CM | POA: Diagnosis not present

## 2018-09-22 DIAGNOSIS — F319 Bipolar disorder, unspecified: Secondary | ICD-10-CM | POA: Diagnosis not present

## 2018-09-22 DIAGNOSIS — K219 Gastro-esophageal reflux disease without esophagitis: Secondary | ICD-10-CM | POA: Diagnosis not present

## 2018-10-06 DIAGNOSIS — E785 Hyperlipidemia, unspecified: Secondary | ICD-10-CM | POA: Diagnosis not present

## 2018-10-06 DIAGNOSIS — F419 Anxiety disorder, unspecified: Secondary | ICD-10-CM | POA: Diagnosis not present

## 2018-10-06 DIAGNOSIS — F0151 Vascular dementia with behavioral disturbance: Secondary | ICD-10-CM | POA: Diagnosis not present

## 2018-10-06 DIAGNOSIS — F319 Bipolar disorder, unspecified: Secondary | ICD-10-CM | POA: Diagnosis not present

## 2018-10-06 DIAGNOSIS — I739 Peripheral vascular disease, unspecified: Secondary | ICD-10-CM | POA: Diagnosis not present

## 2018-10-20 DIAGNOSIS — I739 Peripheral vascular disease, unspecified: Secondary | ICD-10-CM | POA: Diagnosis not present

## 2018-10-20 DIAGNOSIS — F319 Bipolar disorder, unspecified: Secondary | ICD-10-CM | POA: Diagnosis not present

## 2018-10-20 DIAGNOSIS — F0151 Vascular dementia with behavioral disturbance: Secondary | ICD-10-CM | POA: Diagnosis not present

## 2018-10-20 DIAGNOSIS — E785 Hyperlipidemia, unspecified: Secondary | ICD-10-CM | POA: Diagnosis not present

## 2018-11-03 DIAGNOSIS — E785 Hyperlipidemia, unspecified: Secondary | ICD-10-CM | POA: Diagnosis not present

## 2018-11-03 DIAGNOSIS — F0151 Vascular dementia with behavioral disturbance: Secondary | ICD-10-CM | POA: Diagnosis not present

## 2018-11-03 DIAGNOSIS — F329 Major depressive disorder, single episode, unspecified: Secondary | ICD-10-CM | POA: Diagnosis not present

## 2018-11-03 DIAGNOSIS — F319 Bipolar disorder, unspecified: Secondary | ICD-10-CM | POA: Diagnosis not present

## 2018-11-17 DIAGNOSIS — F319 Bipolar disorder, unspecified: Secondary | ICD-10-CM | POA: Diagnosis not present

## 2018-11-17 DIAGNOSIS — B373 Candidiasis of vulva and vagina: Secondary | ICD-10-CM | POA: Diagnosis not present

## 2018-11-17 DIAGNOSIS — F333 Major depressive disorder, recurrent, severe with psychotic symptoms: Secondary | ICD-10-CM | POA: Diagnosis not present

## 2018-11-17 DIAGNOSIS — F0151 Vascular dementia with behavioral disturbance: Secondary | ICD-10-CM | POA: Diagnosis not present

## 2018-11-30 DIAGNOSIS — I4891 Unspecified atrial fibrillation: Secondary | ICD-10-CM | POA: Diagnosis not present

## 2018-11-30 DIAGNOSIS — F0390 Unspecified dementia without behavioral disturbance: Secondary | ICD-10-CM | POA: Diagnosis not present

## 2018-11-30 DIAGNOSIS — I739 Peripheral vascular disease, unspecified: Secondary | ICD-10-CM | POA: Diagnosis not present

## 2018-11-30 DIAGNOSIS — E785 Hyperlipidemia, unspecified: Secondary | ICD-10-CM | POA: Diagnosis not present

## 2018-12-01 DIAGNOSIS — Z20828 Contact with and (suspected) exposure to other viral communicable diseases: Secondary | ICD-10-CM | POA: Diagnosis not present

## 2018-12-08 DIAGNOSIS — R451 Restlessness and agitation: Secondary | ICD-10-CM | POA: Diagnosis not present

## 2018-12-08 DIAGNOSIS — F333 Major depressive disorder, recurrent, severe with psychotic symptoms: Secondary | ICD-10-CM | POA: Diagnosis not present

## 2018-12-15 DIAGNOSIS — F333 Major depressive disorder, recurrent, severe with psychotic symptoms: Secondary | ICD-10-CM | POA: Diagnosis not present

## 2018-12-15 DIAGNOSIS — I739 Peripheral vascular disease, unspecified: Secondary | ICD-10-CM | POA: Diagnosis not present

## 2018-12-15 DIAGNOSIS — B373 Candidiasis of vulva and vagina: Secondary | ICD-10-CM | POA: Diagnosis not present

## 2018-12-15 DIAGNOSIS — F0151 Vascular dementia with behavioral disturbance: Secondary | ICD-10-CM | POA: Diagnosis not present

## 2018-12-28 DIAGNOSIS — F209 Schizophrenia, unspecified: Secondary | ICD-10-CM | POA: Diagnosis not present

## 2018-12-28 DIAGNOSIS — E785 Hyperlipidemia, unspecified: Secondary | ICD-10-CM | POA: Diagnosis not present

## 2018-12-28 DIAGNOSIS — K529 Noninfective gastroenteritis and colitis, unspecified: Secondary | ICD-10-CM | POA: Diagnosis not present

## 2018-12-28 DIAGNOSIS — I4891 Unspecified atrial fibrillation: Secondary | ICD-10-CM | POA: Diagnosis not present

## 2019-01-01 DIAGNOSIS — E038 Other specified hypothyroidism: Secondary | ICD-10-CM | POA: Diagnosis not present

## 2019-01-01 DIAGNOSIS — I10 Essential (primary) hypertension: Secondary | ICD-10-CM | POA: Diagnosis not present

## 2019-01-01 DIAGNOSIS — M1000 Idiopathic gout, unspecified site: Secondary | ICD-10-CM | POA: Diagnosis not present

## 2019-01-01 DIAGNOSIS — A0472 Enterocolitis due to Clostridium difficile, not specified as recurrent: Secondary | ICD-10-CM | POA: Diagnosis not present

## 2019-01-12 DIAGNOSIS — A0472 Enterocolitis due to Clostridium difficile, not specified as recurrent: Secondary | ICD-10-CM | POA: Diagnosis not present

## 2019-01-12 DIAGNOSIS — K219 Gastro-esophageal reflux disease without esophagitis: Secondary | ICD-10-CM | POA: Diagnosis not present

## 2019-01-12 DIAGNOSIS — E785 Hyperlipidemia, unspecified: Secondary | ICD-10-CM | POA: Diagnosis not present

## 2019-01-12 DIAGNOSIS — I739 Peripheral vascular disease, unspecified: Secondary | ICD-10-CM | POA: Diagnosis not present

## 2019-01-25 DIAGNOSIS — Z79899 Other long term (current) drug therapy: Secondary | ICD-10-CM | POA: Diagnosis not present

## 2019-01-25 DIAGNOSIS — A0472 Enterocolitis due to Clostridium difficile, not specified as recurrent: Secondary | ICD-10-CM | POA: Diagnosis not present

## 2019-01-27 DIAGNOSIS — F0151 Vascular dementia with behavioral disturbance: Secondary | ICD-10-CM | POA: Diagnosis not present

## 2019-01-27 DIAGNOSIS — M6281 Muscle weakness (generalized): Secondary | ICD-10-CM | POA: Diagnosis not present

## 2019-01-27 DIAGNOSIS — M10042 Idiopathic gout, left hand: Secondary | ICD-10-CM | POA: Diagnosis not present

## 2019-01-28 DIAGNOSIS — F0151 Vascular dementia with behavioral disturbance: Secondary | ICD-10-CM | POA: Diagnosis not present

## 2019-01-28 DIAGNOSIS — M10042 Idiopathic gout, left hand: Secondary | ICD-10-CM | POA: Diagnosis not present

## 2019-01-28 DIAGNOSIS — M6281 Muscle weakness (generalized): Secondary | ICD-10-CM | POA: Diagnosis not present

## 2019-01-29 DIAGNOSIS — M10042 Idiopathic gout, left hand: Secondary | ICD-10-CM | POA: Diagnosis not present

## 2019-01-29 DIAGNOSIS — M6281 Muscle weakness (generalized): Secondary | ICD-10-CM | POA: Diagnosis not present

## 2019-01-29 DIAGNOSIS — F0151 Vascular dementia with behavioral disturbance: Secondary | ICD-10-CM | POA: Diagnosis not present

## 2019-02-01 DIAGNOSIS — M6281 Muscle weakness (generalized): Secondary | ICD-10-CM | POA: Diagnosis not present

## 2019-02-01 DIAGNOSIS — F0151 Vascular dementia with behavioral disturbance: Secondary | ICD-10-CM | POA: Diagnosis not present

## 2019-02-01 DIAGNOSIS — M10042 Idiopathic gout, left hand: Secondary | ICD-10-CM | POA: Diagnosis not present

## 2019-02-02 DIAGNOSIS — F0151 Vascular dementia with behavioral disturbance: Secondary | ICD-10-CM | POA: Diagnosis not present

## 2019-02-02 DIAGNOSIS — M10042 Idiopathic gout, left hand: Secondary | ICD-10-CM | POA: Diagnosis not present

## 2019-02-02 DIAGNOSIS — M6281 Muscle weakness (generalized): Secondary | ICD-10-CM | POA: Diagnosis not present

## 2019-02-03 DIAGNOSIS — F0151 Vascular dementia with behavioral disturbance: Secondary | ICD-10-CM | POA: Diagnosis not present

## 2019-02-03 DIAGNOSIS — M6281 Muscle weakness (generalized): Secondary | ICD-10-CM | POA: Diagnosis not present

## 2019-02-03 DIAGNOSIS — M10042 Idiopathic gout, left hand: Secondary | ICD-10-CM | POA: Diagnosis not present

## 2019-02-04 DIAGNOSIS — M10042 Idiopathic gout, left hand: Secondary | ICD-10-CM | POA: Diagnosis not present

## 2019-02-04 DIAGNOSIS — M6281 Muscle weakness (generalized): Secondary | ICD-10-CM | POA: Diagnosis not present

## 2019-02-04 DIAGNOSIS — F0151 Vascular dementia with behavioral disturbance: Secondary | ICD-10-CM | POA: Diagnosis not present

## 2019-02-05 DIAGNOSIS — M10042 Idiopathic gout, left hand: Secondary | ICD-10-CM | POA: Diagnosis not present

## 2019-02-05 DIAGNOSIS — M6281 Muscle weakness (generalized): Secondary | ICD-10-CM | POA: Diagnosis not present

## 2019-02-05 DIAGNOSIS — F0151 Vascular dementia with behavioral disturbance: Secondary | ICD-10-CM | POA: Diagnosis not present

## 2021-06-28 DEATH — deceased
